# Patient Record
Sex: Female | Born: 1965 | Race: White | Hispanic: No | Marital: Married | Smoking: Former smoker
Health system: Southern US, Community
[De-identification: ages and names within clinical notes are randomized; demographics above are authoritative.]

## PROBLEM LIST (undated history)

## (undated) DIAGNOSIS — E079 Disorder of thyroid, unspecified: Secondary | ICD-10-CM

## (undated) DIAGNOSIS — E039 Hypothyroidism, unspecified: Secondary | ICD-10-CM

## (undated) DIAGNOSIS — R896 Abnormal cytological findings in specimens from other organs, systems and tissues: Secondary | ICD-10-CM

## (undated) HISTORY — DX: Abnormal cytological findings in specimens from other organs, systems and tissues: R89.6

## (undated) HISTORY — DX: Disorder of thyroid, unspecified: E07.9

## (undated) HISTORY — PX: BREAST BIOPSY: SHX20

---

## 2004-12-29 ENCOUNTER — Ambulatory Visit (HOSPITAL_COMMUNITY): Admission: RE | Admit: 2004-12-29 | Discharge: 2004-12-29 | Payer: Self-pay | Admitting: Family Medicine

## 2005-04-11 ENCOUNTER — Encounter: Admission: RE | Admit: 2005-04-11 | Discharge: 2005-04-11 | Payer: Self-pay | Admitting: Obstetrics and Gynecology

## 2005-04-11 ENCOUNTER — Encounter (INDEPENDENT_AMBULATORY_CARE_PROVIDER_SITE_OTHER): Payer: Self-pay | Admitting: Specialist

## 2006-05-20 ENCOUNTER — Encounter: Admission: RE | Admit: 2006-05-20 | Discharge: 2006-05-20 | Payer: Self-pay | Admitting: Obstetrics and Gynecology

## 2007-06-06 ENCOUNTER — Encounter: Admission: RE | Admit: 2007-06-06 | Discharge: 2007-06-06 | Payer: Self-pay | Admitting: Obstetrics and Gynecology

## 2008-10-14 ENCOUNTER — Encounter: Admission: RE | Admit: 2008-10-14 | Discharge: 2008-10-14 | Payer: Self-pay | Admitting: Obstetrics and Gynecology

## 2010-02-03 ENCOUNTER — Encounter: Admission: RE | Admit: 2010-02-03 | Discharge: 2010-02-03 | Payer: Self-pay | Admitting: Obstetrics and Gynecology

## 2010-12-10 DIAGNOSIS — IMO0001 Reserved for inherently not codable concepts without codable children: Secondary | ICD-10-CM

## 2010-12-10 HISTORY — DX: Reserved for inherently not codable concepts without codable children: IMO0001

## 2011-09-06 ENCOUNTER — Other Ambulatory Visit: Payer: Self-pay | Admitting: Gynecology

## 2011-09-06 DIAGNOSIS — Z1231 Encounter for screening mammogram for malignant neoplasm of breast: Secondary | ICD-10-CM

## 2011-09-14 ENCOUNTER — Ambulatory Visit
Admission: RE | Admit: 2011-09-14 | Discharge: 2011-09-14 | Disposition: A | Discharge status: Home / Self Care | Payer: 59 | Source: Ambulatory Visit | Attending: Gynecology | Admitting: Gynecology

## 2011-09-14 DIAGNOSIS — Z1231 Encounter for screening mammogram for malignant neoplasm of breast: Secondary | ICD-10-CM

## 2011-09-27 ENCOUNTER — Ambulatory Visit: Payer: Self-pay | Admitting: Gynecology

## 2011-10-09 ENCOUNTER — Ambulatory Visit (INDEPENDENT_AMBULATORY_CARE_PROVIDER_SITE_OTHER): Payer: 59 | Admitting: Gynecology

## 2011-10-09 ENCOUNTER — Other Ambulatory Visit (HOSPITAL_COMMUNITY)
Admission: RE | Admit: 2011-10-09 | Discharge: 2011-10-09 | Disposition: A | Discharge status: Home / Self Care | Payer: 59 | Source: Ambulatory Visit | Attending: Gynecology | Admitting: Gynecology

## 2011-10-09 ENCOUNTER — Encounter: Payer: Self-pay | Admitting: Gynecology

## 2011-10-09 VITALS — BP 130/70 | Ht 65.25 in | Wt 130.0 lb

## 2011-10-09 DIAGNOSIS — Z1159 Encounter for screening for other viral diseases: Secondary | ICD-10-CM | POA: Insufficient documentation

## 2011-10-09 DIAGNOSIS — Z01419 Encounter for gynecological examination (general) (routine) without abnormal findings: Secondary | ICD-10-CM | POA: Insufficient documentation

## 2011-10-09 NOTE — Progress Notes (Signed)
Catherine Pugh [birth year] [id number]        45 y.o.  for annual exam.  Without complaints former patient of Dr. August Saucer [facility].  Past medical history,surgical history, medications, allergies, family history and social history were all reviewed and documented in the EPIC chart. ROS:  Was performed and pertinent positives and negatives are included in the history.  Exam: chaperone present Filed Vitals:   10/09/11 1541  BP: 130/70   General appearance  Normal Skin grossly normal Head/Neck normal with no cervical or supraclavicular adenopathy thyroid normal Lungs  clear Cardiac RR, without RMG Abdominal  soft, nontender, without masses, organomegaly or hernia Breasts  examined lying and sitting without masses, retractions, discharge or axillary adenopathy. Pelvic  Ext/BUS/vagina  normal   Cervix  normal  Pap done  Uterus  retroverted, normal size, shape and contour, midline and mobile nontender   Adnexa  Without masses or tenderness    Anus and perineum  normal   Rectovaginal  normal sphincter tone without palpated masses or tenderness.    Assessment/Plan:  45 y.o. female for annual exam.   Doing well. SBE monthly reviewed. Had mammogram this year and will continue annual mammography.  Pap was done today as I do not have copies of her other Pap smears. I discussed the current screening guidelines with less frequent screening interval I think assuming this Pap is normal then we'll go to every 3 years. Patient using condoms for birth control. I reviewed the failure risk with this and availability of Plan B. She is comfortable with condoms and accepts the failure risk. No labs were done today as it was recently done through her primary's office. Assuming she continues well from a gynecologic standpoint she will see [place] in a year sooner as needed.   Dara Lords MD, 4:25 PM 10/09/2011

## 2012-12-22 ENCOUNTER — Other Ambulatory Visit: Payer: Self-pay | Admitting: Gynecology

## 2012-12-22 DIAGNOSIS — Z1231 Encounter for screening mammogram for malignant neoplasm of breast: Secondary | ICD-10-CM

## 2012-12-25 ENCOUNTER — Ambulatory Visit (INDEPENDENT_AMBULATORY_CARE_PROVIDER_SITE_OTHER): Payer: 59 | Admitting: Gynecology

## 2012-12-25 ENCOUNTER — Encounter: Payer: Self-pay | Admitting: Gynecology

## 2012-12-25 ENCOUNTER — Other Ambulatory Visit (HOSPITAL_COMMUNITY)
Admission: RE | Admit: 2012-12-25 | Discharge: 2012-12-25 | Disposition: A | Discharge status: Home / Self Care | Payer: 59 | Source: Ambulatory Visit | Attending: Gynecology | Admitting: Gynecology

## 2012-12-25 VITALS — BP 116/66 | Ht 64.0 in | Wt 138.0 lb

## 2012-12-25 DIAGNOSIS — Z01419 Encounter for gynecological examination (general) (routine) without abnormal findings: Secondary | ICD-10-CM

## 2012-12-25 DIAGNOSIS — Z1151 Encounter for screening for human papillomavirus (HPV): Secondary | ICD-10-CM | POA: Insufficient documentation

## 2012-12-25 DIAGNOSIS — Z13220 Encounter for screening for lipoid disorders: Secondary | ICD-10-CM

## 2012-12-25 DIAGNOSIS — N912 Amenorrhea, unspecified: Secondary | ICD-10-CM

## 2012-12-25 LAB — CBC WITH DIFFERENTIAL/PLATELET
Basophils Absolute: 0 10*3/uL (ref 0.0–0.1)
HCT: 41.4 % (ref 36.0–46.0)
Hemoglobin: 14 g/dL (ref 12.0–15.0)
Lymphocytes Relative: 29 % (ref 12–46)
Lymphs Abs: 1.8 10*3/uL (ref 0.7–4.0)
Monocytes Absolute: 0.4 10*3/uL (ref 0.1–1.0)
Neutro Abs: 3.9 10*3/uL (ref 1.7–7.7)
RBC: 4.41 MIL/uL (ref 3.87–5.11)
RDW: 13.9 % (ref 11.5–15.5)
WBC: 6.3 10*3/uL (ref 4.0–10.5)

## 2012-12-25 LAB — COMPREHENSIVE METABOLIC PANEL
ALT: 16 U/L (ref 0–35)
AST: 19 U/L (ref 0–37)
Albumin: 4.2 g/dL (ref 3.5–5.2)
BUN: 9 mg/dL (ref 6–23)
Calcium: 9.3 mg/dL (ref 8.4–10.5)
Chloride: 106 mEq/L (ref 96–112)
Potassium: 4.2 mEq/L (ref 3.5–5.3)

## 2012-12-25 LAB — TSH: TSH: 2.812 u[IU]/mL (ref 0.350–4.500)

## 2012-12-25 LAB — FOLLICLE STIMULATING HORMONE: FSH: 124.8 m[IU]/mL — ABNORMAL HIGH

## 2012-12-25 NOTE — Patient Instructions (Addendum)
Followup in one year for annual exam.  Consider Stop Smoking.  Help is available at [facility]'s smoking cessation program @ www.[place].com or 336-832-0838. OR 1-800-QUIT-NOW (1-800-784-8669) for free smoking cessation counseling.  Smokefree.gov (http://www.smokefree.gov) provides free, accurate, evidence-based information and professional assistance to help support the immediate and long-term needs of people trying to quit smoking.    Smoking Hazards Smoking cigarettes is extremely bad for your health. Tobacco smoke has over 200 known poisons in it. There are over 60 chemicals in tobacco smoke that cause cancer. Some of the chemicals found in cigarette smoke include:  Cyanide.  Benzene.  Formaldehyde.  Methanol (wood alcohol).  Acetylene (fuel used in welding torches).  Ammonia.  Cigarette smoke also contains the poisonous gases nitrogen oxide and carbon monoxide.  Cigarette smokers have an increased risk of many serious medical problems, including: Lung cancer.  Lung disease (such as pneumonia, bronchitis, and emphysema).  Heart attack and chest pain due to the heart not getting enough oxygen (angina).  Heart disease and peripheral blood vessel disease.  Hypertension.  Stroke.  Oral cancer (cancer of the lip, mouth, or voice box).  Bladder cancer.  Pancreatic cancer.  Cervical cancer.  Pregnancy complications, including premature birth.  Low birthweight babies.  Early menopause.  Lower estrogen level for women.  Infertility.  Facial wrinkles.  Blindness.  Increased risk of broken bones (fractures).  Senile dementia.  Stillbirths and smaller newborn babies, birth defects, and genetic damage to sperm.  Stomach ulcers and internal bleeding.  Children of smokers have an increased risk of the following, because of secondhand smoke exposure:  Sudden infant death syndrome (SIDS).  Respiratory infections.  Lung cancer.  Heart disease.  Ear infections.  Smoking causes  approximately: 90% of all lung cancer deaths in men.  80% of all lung cancer deaths in women.  90% of deaths from chronic obstructive lung disease.  Compared with nonsmokers, smoking increases the risk of: Coronary heart disease by 2 to 4 times.  Stroke by 2 to 4 times.  Men developing lung cancer by 23 times.  Women developing lung cancer by 13 times.  Dying from chronic obstructive lung diseases by 12 times.  Someone who smokes 2 packs a day loses about 8 years of his or her life. Even smoking lightly shortens your life expectancy by several years. You can greatly reduce the risk of medical problems for you and your family by stopping now. Smoking is the most preventable cause of death and disease in our society. Within days of quitting smoking, your circulation returns to normal, you decrease the risk of having a heart attack, and your lung capacity improves. There may be some increased phlegm in the first few days after quitting, and it may take months for your lungs to clear up completely. Quitting for 10 years cuts your lung cancer risk to almost that of a nonsmoker. WHY IS SMOKING ADDICTIVE? Nicotine is the chemical agent in tobacco that is capable of causing addiction or dependence.  When you smoke and inhale, nicotine is absorbed rapidly into the bloodstream through your lungs. Nicotine absorbed through the lungs is capable of creating a powerful addiction. Both inhaled and non-inhaled nicotine may be addictive.  Addiction studies of cigarettes and spit tobacco show that addiction to nicotine occurs mainly during the teen years, when young people begin using tobacco products.  WHAT ARE THE BENEFITS OF QUITTING?  There are many health benefits to quitting smoking.  Likelihood of developing cancer and heart disease   decreases. Health improvements are seen almost immediately.  Blood pressure, pulse rate, and breathing patterns start returning to normal soon after quitting.  People who quit  may see an improvement in their overall quality of life.  Some people choose to quit all at once. Other options include nicotine replacement products, such as patches, gum, and nasal sprays. Do not use these products without first checking with your caregiver. QUITTING SMOKING It is not easy to quit smoking. Nicotine is addicting, and longtime habits are hard to change. To start, you can write down all your reasons for quitting, tell your family and friends you want to quit, and ask for their help. Throw your cigarettes away, chew gum or cinnamon sticks, keep your hands busy, and drink extra water or juice. Go for walks and practice deep breathing to relax. Think of all the money you are saving: around $1,000 a year, for the average pack-a-day smoker. Nicotine patches and gum have been shown to improve success at efforts to stop smoking. Zyban (bupropion) is an anti-depressant drug that can be prescribed to reduce nicotine withdrawal symptoms and to suppress the urge to smoke. Smoking is an addiction with both physical and psychological effects. Joining a stop-smoking support group can help you cope with the emotional issues. For more information and advice on programs to stop smoking, call your doctor, your local hospital, or these organizations: American Lung Association - 1-800-LUNGUSA   Smoking Cessation  This document explains the best ways for you to quit smoking and new treatments to help. It lists new medicines that can double or triple your chances of quitting and quitting for good. It also considers ways to avoid relapses and concerns you may have about quitting, including weight gain. NICOTINE: A POWERFUL ADDICTION If you have tried to quit smoking, you know how hard it can be. It is hard because nicotine is a very addictive drug. For some people, it can be as addictive as heroin or cocaine. Usually, people make 2 or 3 tries, or more, before finally being able to quit. Each time you try to  quit, you can learn about what helps and what hurts. Quitting takes hard work and a lot of effort, but you can quit smoking. QUITTING SMOKING IS ONE OF THE MOST IMPORTANT THINGS YOU WILL EVER DO.  You will live longer, feel better, and live better.   The impact on your body of quitting smoking is felt almost immediately:   Within 20 minutes, blood pressure decreases. Pulse returns to its normal level.   After 8 hours, carbon monoxide levels in the blood return to normal. Oxygen level increases.   After 24 hours, chance of heart attack starts to decrease. Breath, hair, and body stop smelling like smoke.   After 48 hours, damaged nerve endings begin to recover. Sense of taste and smell improve.   After 72 hours, the body is virtually free of nicotine. Bronchial tubes relax and breathing becomes easier.   After 2 to 12 weeks, lungs can hold more air. Exercise becomes easier and circulation improves.   Quitting will reduce your risk of having a heart attack, stroke, cancer, or lung disease:   After 1 year, the risk of coronary heart disease is cut in half.   After 5 years, the risk of stroke falls to the same as a nonsmoker.   After 10 years, the risk of lung cancer is cut in half and the risk of other cancers decreases significantly.   After 15 years,   the risk of coronary heart disease drops, usually to the level of a nonsmoker.   If you are pregnant, quitting smoking will improve your chances of having a healthy baby.   The people you live with, especially your children, will be healthier.   You will have extra money to spend on things other than cigarettes.  FIVE KEYS TO QUITTING Studies have shown that these 5 steps will help you quit smoking and quit for good. You have the best chances of quitting if you use them together: 1. Get ready.  2. Get support and encouragement.  3. Learn new skills and behaviors.  4. Get medicine to reduce your nicotine addiction and use it  correctly.  5. Be prepared for relapse or difficult situations. Be determined to continue trying to quit, even if you do not succeed at first.  1. GET READY  Set a quit date.   Change your environment.   Get rid of ALL cigarettes, ashtrays, matches, and lighters in your home, car, and place of work.   Do not let people smoke in your home.   Review your past attempts to quit. Think about what worked and what did not.   Once you quit, do not smoke. NOT EVEN A PUFF!  2. GET SUPPORT AND ENCOURAGEMENT Studies have shown that you have a better chance of being successful if you have help. You can get support in many ways.  Tell your family, friends, and coworkers that you are going to quit and need their support. Ask them not to smoke around you.   Talk to your caregivers (doctor, dentist, nurse, pharmacist, psychologist, and/or smoking counselor).   Get individual, group, or telephone counseling and support. The more counseling you have, the better your chances are of quitting. Programs are available at local hospitals and health centers. Call your local health department for information about programs in your area.   Spiritual beliefs and practices may help some smokers quit.   Quit meters are small computer programs online or downloadable that keep track of quit statistics, such as amount of "quit-time," cigarettes not smoked, and money saved.   Many smokers find one or more of the many self-help books available useful in helping them quit and stay off tobacco.  3. LEARN NEW SKILLS AND BEHAVIORS  Try to distract yourself from urges to smoke. Talk to someone, go for a walk, or occupy your time with a task.   When you first try to quit, change your routine. Take a different route to work. Drink tea instead of coffee. Eat breakfast in a different place.   Do something to reduce your stress. Take a hot bath, exercise, or read a book.   Plan something enjoyable to do every day. Reward  yourself for not smoking.   Explore interactive web-based programs that specialize in helping you quit.  4. GET MEDICINE AND USE IT CORRECTLY Medicines can help you stop smoking and decrease the urge to smoke. Combining medicine with the above behavioral methods and support can quadruple your chances of successfully quitting smoking. The U.S. Food and Drug Administration (FDA) has approved 7 medicines to help you quit smoking. These medicines fall into 3 categories.  Nicotine replacement therapy (delivers nicotine to your body without the negative effects and risks of smoking):   Nicotine gum: Available over-the-counter.   Nicotine lozenges: Available over-the-counter.   Nicotine inhaler: Available by prescription.   Nicotine nasal spray: Available by prescription.   Nicotine skin patches (transdermal): Available   by prescription and over-the-counter.   Antidepressant medicine (helps people abstain from smoking, but how this works is unknown):   Bupropion sustained-release (SR) tablets: Available by prescription.   Nicotinic receptor partial agonist (simulates the effect of nicotine in your brain):   Varenicline tartrate tablets: Available by prescription.   Ask your caregiver for advice about which medicines to use and how to use them. Carefully read the information on the package.   Everyone who is trying to quit may benefit from using a medicine. If you are pregnant or trying to become pregnant, nursing an infant, you are under age 18, or you smoke fewer than 10 cigarettes per day, talk to your caregiver before taking any nicotine replacement medicines.   You should stop using a nicotine replacement product and call your caregiver if you experience nausea, dizziness, weakness, vomiting, fast or irregular heartbeat, mouth problems with the lozenge or gum, or redness or swelling of the skin around the patch that does not go away.   Do not use any other product containing nicotine  while using a nicotine replacement product.   Talk to your caregiver before using these products if you have diabetes, heart disease, asthma, stomach ulcers, you had a recent heart attack, you have high blood pressure that is not controlled with medicine, a history of irregular heartbeat, or you have been prescribed medicine to help you quit smoking.  5. BE PREPARED FOR RELAPSE OR DIFFICULT SITUATIONS  Most relapses occur within the first 3 months after quitting. Do not be discouraged if you start smoking again. Remember, most people try several times before they finally quit.   You may have symptoms of withdrawal because your body is used to nicotine. You may crave cigarettes, be irritable, feel very hungry, cough often, get headaches, or have difficulty concentrating.   The withdrawal symptoms are only temporary. They are strongest when you first quit, but they will go away within 10 to 14 days.  Here are some difficult situations to watch for:  Alcohol. Avoid drinking alcohol. Drinking lowers your chances of successfully quitting.   Caffeine. Try to reduce the amount of caffeine you consume. It also lowers your chances of successfully quitting.   Other smokers. Being around smoking can make you want to smoke. Avoid smokers.   Weight gain. Many smokers will gain weight when they quit, usually less than 10 pounds. Eat a healthy diet and stay active. Do not let weight gain distract you from your main goal, quitting smoking. Some medicines that help you quit smoking may also help delay weight gain. You can always lose the weight gained after you quit.   Bad mood or depression. There are a lot of ways to improve your mood other than smoking.  If you are having problems with any of these situations, talk to your caregiver. SPECIAL SITUATIONS AND CONDITIONS Studies suggest that everyone can quit smoking. Your situation or condition can give you a special reason to quit.  Pregnant women/new  mothers: By quitting, you protect your baby's health and your own.   Hospitalized patients: By quitting, you reduce health problems and help healing.   Heart attack patients: By quitting, you reduce your risk of a second heart attack.   Lung, head, and neck cancer patients: By quitting, you reduce your chance of a second cancer.   Parents of children and adolescents: By quitting, you protect your children from illnesses caused by secondhand smoke.  QUESTIONS TO THINK ABOUT Think about the following questions   before you try to stop smoking. You may want to talk about your answers with your caregiver.  Why do you want to quit?   If you tried to quit in the past, what helped and what did not?   What will be the most difficult situations for you after you quit? How will you plan to handle them?   Who can help you through the tough times? Your family? Friends? Caregiver?   What pleasures do you get from smoking? What ways can you still get pleasure if you quit?  Here are some questions to ask your caregiver:  How can you help me to be successful at quitting?   What medicine do you think would be best for me and how should I take it?   What should I do if I need more help?   What is smoking withdrawal like? How can I get information on withdrawal?  Quitting takes hard work and a lot of effort, but you can quit smoking.   

## 2012-12-25 NOTE — Progress Notes (Signed)
Catherine Pugh Mar 12, 1966 [id number]        47 y.o.  G2P2 for annual exam.  Several issues that are below  Past medical history,surgical history, medications, allergies, family history and social history were all reviewed and documented in the EPIC chart. ROS:  Was performed and pertinent positives and negatives are included in the history.  Exam: Kim assistant Filed Vitals:   12/25/12 1125  BP: 116/66  Height: 5\' 4"  (1.626 m)  Weight: 138 lb (62.596 kg)   General appearance  Normal Skin grossly normal Head/Neck normal with no cervical or supraclavicular adenopathy thyroid normal Lungs  clear Cardiac RR, without RMG Abdominal  soft, nontender, without masses, organomegaly or hernia Breasts  examined lying and sitting without masses, retractions, discharge or axillary adenopathy. Pelvic  Ext/BUS/vagina  normal   Cervix  normal Pap/HPV  Uterus  anteverted, normal size, shape and contour, midline and mobile nontender   Adnexa  Without masses or tenderness    Anus and perineum  normal   Rectovaginal  normal sphincter tone without palpated masses or tenderness.    Assessment/Plan:  47 y.o. G2P2 female for annual exam.   1. Amenorrhea/menopausal symptoms. Patient notes her menses have become more sporadic this past year which she would have skips and then no menses since LMP 08/10/2012. She is having hot flashes and night sweats. No skin hair or weight changes. Will check FSH TSH. Discussed probable menopause and options to include expectant management, trial of soy based products, HRT. WHI study with risks to include stroke heart attack DVT of breast cancer reviewed. Patient is not interested in treatment at this time but would prefer observation. She will keep a menstrual calendar. As long as no menses or occasional normal menses then will follow. If she has prolonged or atypical bleeding or goes more than one year without bleeding she does report this. Need for contraception until one  year without menses also discussed. 2. Mammography 2012. Patient overdo it has scheduled and will follow up for this. SBE monthly reviewed. 3. Pap smear 2012 showed ASCUS negative high-risk HPV. Pap/HPV done today. 4. Stop smoking reviewed and strategies discussed. 5. Health maintenance. Baseline CBC comprehensive metabolic panel lipid profile urinalysis ordered along with her FSH TSH. Follow up for results and then annually, sooner as needed    Dara Lords MD, 12:19 PM 12/25/2012

## 2012-12-26 LAB — URINALYSIS W MICROSCOPIC + REFLEX CULTURE
Casts: NONE SEEN
Ketones, ur: NEGATIVE mg/dL
Nitrite: NEGATIVE
Protein, ur: NEGATIVE mg/dL
Urobilinogen, UA: 0.2 mg/dL (ref 0.0–1.0)

## 2012-12-29 ENCOUNTER — Other Ambulatory Visit: Payer: Self-pay | Admitting: Gynecology

## 2012-12-29 LAB — URINE CULTURE: Colony Count: >=100000

## 2012-12-29 MED ORDER — SULFAMETHOXAZOLE-TRIMETHOPRIM 800-160 MG PO TABS: 1.0000 | ORAL_TABLET | Freq: Two times a day (BID) | ORAL | Status: DC

## 2013-01-14 ENCOUNTER — Ambulatory Visit: Payer: 59

## 2013-01-24 ENCOUNTER — Other Ambulatory Visit: Payer: Self-pay

## 2013-01-28 ENCOUNTER — Ambulatory Visit
Admission: RE | Admit: 2013-01-28 | Discharge: 2013-01-28 | Disposition: A | Discharge status: Home / Self Care | Payer: 59 | Source: Ambulatory Visit | Attending: Gynecology | Admitting: Gynecology

## 2013-03-31 ENCOUNTER — Telehealth: Payer: Self-pay | Admitting: Family Medicine

## 2013-03-31 MED ORDER — FEXOFENADINE HCL 180 MG PO TABS: 180.0000 mg | ORAL_TABLET | Freq: Every day | ORAL | Status: DC

## 2013-03-31 NOTE — Telephone Encounter (Signed)
Rx sent in to [facility]. Patient notified. Patient states that she also needs a prescription for Patanol sent in to pharmacy.

## 2013-03-31 NOTE — Telephone Encounter (Signed)
Allegra 180 numb 30 one qd three ref

## 2013-03-31 NOTE — Telephone Encounter (Signed)
Patient needs a season allergy med called into [place]

## 2013-04-01 ENCOUNTER — Other Ambulatory Visit: Payer: Self-pay

## 2013-04-01 MED ORDER — OLOPATADINE HCL 0.1 % OP SOLN: 1.0000 [drp] | Freq: Two times a day (BID) | OPHTHALMIC | Status: DC

## 2013-04-01 NOTE — Telephone Encounter (Signed)
patanol one drop bid three ref

## 2013-04-01 NOTE — Telephone Encounter (Signed)
Patanol one drop bid three ref sent in to [place]. Patient notified.

## 2013-07-31 ENCOUNTER — Encounter: Payer: Self-pay | Admitting: Family Medicine

## 2013-07-31 ENCOUNTER — Ambulatory Visit (INDEPENDENT_AMBULATORY_CARE_PROVIDER_SITE_OTHER): Payer: 59 | Admitting: Family Medicine

## 2013-07-31 VITALS — BP 112/76 | Ht 65.0 in | Wt 146.2 lb

## 2013-07-31 DIAGNOSIS — M7712 Lateral epicondylitis, left elbow: Secondary | ICD-10-CM | POA: Insufficient documentation

## 2013-07-31 DIAGNOSIS — R232 Flushing: Secondary | ICD-10-CM | POA: Insufficient documentation

## 2013-07-31 DIAGNOSIS — M722 Plantar fascial fibromatosis: Secondary | ICD-10-CM | POA: Insufficient documentation

## 2013-07-31 DIAGNOSIS — N951 Menopausal and female climacteric states: Secondary | ICD-10-CM

## 2013-07-31 DIAGNOSIS — M7710 Lateral epicondylitis, unspecified elbow: Secondary | ICD-10-CM

## 2013-07-31 MED ORDER — DICLOFENAC SODIUM 75 MG PO TBEC: 75.0000 mg | DELAYED_RELEASE_TABLET | Freq: Two times a day (BID) | ORAL | Status: DC

## 2013-07-31 NOTE — Patient Instructions (Signed)
Plantar Fasciitis Plantar fasciitis is a common condition that causes foot pain. It is soreness (inflammation) of the band of tough fibrous tissue on the bottom of the foot that runs from the heel bone (calcaneus) to the ball of the foot. The cause of this soreness may be from excessive standing, poor fitting shoes, running on hard surfaces, being overweight, having an abnormal walk, or overuse (this is common in runners) of the painful foot or feet. It is also common in aerobic exercise dancers and ballet dancers. SYMPTOMS  Most people with plantar fasciitis complain of: Severe pain in the morning on the bottom of their foot especially when taking the first steps out of bed. This pain recedes after a few minutes of walking. Severe pain is experienced also during walking following a long period of inactivity. Pain is worse when walking barefoot or up stairs DIAGNOSIS  Your caregiver will diagnose this condition by examining and feeling your foot. Special tests such as X-rays of your foot, are usually not needed. PREVENTION  Consult a sports medicine professional before beginning a new exercise program. Walking programs offer a good workout. With walking there is a lower chance of overuse injuries common to runners. There is less impact and less jarring of the joints. Begin all new exercise programs slowly. If problems or pain develop, decrease the amount of time or distance until you are at a comfortable level. Wear good shoes and replace them regularly. Stretch your foot and the heel cords at the back of the ankle (Achilles tendon) both before and after exercise. Run or exercise on even surfaces that are not hard. For example, asphalt is better than pavement. Do not run barefoot on hard surfaces. If using a treadmill, vary the incline. Do not continue to workout if you have foot or joint problems. Seek professional help if they do not improve. HOME CARE INSTRUCTIONS  Avoid activities that cause  you pain until you recover. Use ice or cold packs on the problem or painful areas after working out. Only take over-the-counter or prescription medicines for pain, discomfort, or fever as directed by your caregiver. Soft shoe inserts or athletic shoes with air or gel sole cushions may be helpful. If problems continue or become more severe, consult a sports medicine caregiver or your own health care provider. Cortisone is a potent anti-inflammatory medication that may be injected into the painful area. You can discuss this treatment with your caregiver. MAKE SURE YOU:  Understand these instructions. Will watch your condition. Will get help right away if you are not doing well or get worse. Document Released: 08/21/2001 Document Revised: 02/18/2012 Document Reviewed: 10/20/2008 [facility] Patient Information 2014 [place], [place]. Lateral Epicondylitis (Tennis Elbow) with Rehab Lateral epicondylitis involves inflammation and pain around the outer portion of the elbow. The pain is caused by inflammation of the tendons in the forearm that bring back (extend) the wrist. Lateral epicondylittis is also called tennis elbow, because it is very common in tennis players. However, it may occur in any individual who extends the wrist repetitively. If lateral epicondylitis is left untreated, it may become a chronic problem. SYMPTOMS   Pain, tenderness, and inflammation on the outer (lateral) side of the elbow.  Pain or weakness with gripping activities.  Pain that increases with wrist twisting motions (playing tennis, using a screwdriver, opening a door or a jar).  Pain with lifting objects, including a coffee cup. CAUSES  Lateral epicondylitis is caused by inflammation of the tendons that extend the wrist.  Causes of injury may include:  Repetitive stress and strain on the muscles and tendons that extend the wrist.  Sudden change in activity level or intensity.  Incorrect grip in racquet  sports.  Incorrect grip size of racquet (often too large).  Incorrect hitting position or technique (usually backhand, leading with the elbow).  Using a racket that is too heavy. RISK INCREASES WITH:  Sports or occupations that require repetitive and/or strenuous forearm and wrist movements (tennis, squash, racquetball, carpentry).  Poor wrist and forearm strength and flexibility.  Failure to warm up properly before activity.  Resuming activity before healing, rehabilitation, and conditioning are complete. PREVENTION   Warm up and stretch properly before activity.  Maintain physical fitness:  Strength, flexibility, and endurance.  Cardiovascular fitness.  Wear and use properly fitted equipment.  Learn and use proper technique and have a coach correct improper technique.  Wear a tennis elbow (counterforce) brace. PROGNOSIS  The course of this condition depends on the degree of the injury. If treated properly, acute cases (symptoms lasting less than 4 weeks) are often resolved in 2 to 6 weeks. Chronic (longer lasting cases) often resolve in 3 to 6 months, but may require physical therapy. RELATED COMPLICATIONS   Frequently recurring symptoms, resulting in a chronic problem. Properly treating the problem the first time decreases frequency of recurrence.  Chronic inflammation, scarring tendon degeneration, and partial tendon tear, requiring surgery.  Delayed healing or resolution of symptoms. TREATMENT  Treatment first involves the use of ice and medicine, to reduce pain and inflammation. Strengthening and stretching exercises may help reduce discomfort, if performed regularly. These exercises may be performed at home, if the condition is an acute injury. Chronic cases may require a referral to a physical therapist for evaluation and treatment. Your caregiver may advise a corticosteroid injection, to help reduce inflammation. Rarely, surgery is needed. MEDICATION  If pain  medicine is needed, nonsteroidal anti-inflammatory medicines (aspirin and ibuprofen), or other minor pain relievers (acetaminophen), are often advised.  Do not take pain medicine for 7 days before surgery.  Prescription pain relievers may be given, if your caregiver thinks they are needed. Use only as directed and only as much as you need.  Corticosteroid injections may be recommended. These injections should be reserved only for the most severe cases, because they can only be given a certain number of times. HEAT AND COLD  Cold treatment (icing) should be applied for 10 to 15 minutes every 2 to 3 hours for inflammation and pain, and immediately after activity that aggravates your symptoms. Use ice packs or an ice massage.  Heat treatment may be used before performing stretching and strengthening activities prescribed by your caregiver, physical therapist, or athletic trainer. Use a heat pack or a warm water soak. SEEK MEDICAL CARE IF: Symptoms get worse or do not improve in 2 weeks, despite treatment. EXERCISES  RANGE OF MOTION (ROM) AND STRETCHING EXERCISES - Epicondylitis, Lateral (Tennis Elbow) These exercises may help you when beginning to rehabilitate your injury. Your symptoms may go away with or without further involvement from your physician, physical therapist or athletic trainer. While completing these exercises, remember:   Restoring tissue flexibility helps normal motion to return to the joints. This allows healthier, less painful movement and activity.  An effective stretch should be held for at least 30 seconds.  A stretch should never be painful. You should only feel a gentle lengthening or release in the stretched tissue. RANGE OF MOTION  Wrist Flexion, Active-Assisted  Extend your right / left elbow with your fingers pointing down.*  Gently pull the back of your hand towards you, until you feel a gentle stretch on the top of your forearm.  Hold this position for  __________ seconds. Repeat __________ times. Complete this exercise __________ times per day.  *If directed by your physician, physical therapist or athletic trainer, complete this stretch with your elbow bent, rather than extended. RANGE OF MOTION  Wrist Extension, Active-Assisted  Extend your right / left elbow and turn your palm upwards.*  Gently pull your palm and fingertips back, so your wrist extends and your fingers point more toward the ground.  You should feel a gentle stretch on the inside of your forearm.  Hold this position for __________ seconds. Repeat __________ times. Complete this exercise __________ times per day. *If directed by your physician, physical therapist or athletic trainer, complete this stretch with your elbow bent, rather than extended. STRETCH - Wrist Flexion  Place the back of your right / left hand on a tabletop, leaving your elbow slightly bent. Your fingers should point away from your body.  Gently press the back of your hand down onto the table by straightening your elbow. You should feel a stretch on the top of your forearm.  Hold this position for __________ seconds. Repeat __________ times. Complete this stretch __________ times per day.  STRETCH  Wrist Extension   Place your right / left fingertips on a tabletop, leaving your elbow slightly bent. Your fingers should point backwards.  Gently press your fingers and palm down onto the table by straightening your elbow. You should feel a stretch on the inside of your forearm.  Hold this position for __________ seconds. Repeat __________ times. Complete this stretch __________ times per day.  STRENGTHENING EXERCISES - Epicondylitis, Lateral (Tennis Elbow) These exercises may help you when beginning to rehabilitate your injury. They may resolve your symptoms with or without further involvement from your physician, physical therapist or athletic trainer. While completing these exercises, remember:    Muscles can gain both the endurance and the strength needed for everyday activities through controlled exercises.  Complete these exercises as instructed by your physician, physical therapist or athletic trainer. Increase the resistance and repetitions only as guided.  You may experience muscle soreness or fatigue, but the pain or discomfort you are trying to eliminate should never worsen during these exercises. If this pain does get worse, stop and make sure you are following the directions exactly. If the pain is still present after adjustments, discontinue the exercise until you can discuss the trouble with your caregiver. STRENGTH Wrist Flexors  Sit with your right / left forearm palm-up and fully supported on a table or countertop. Your elbow should be resting below the height of your shoulder. Allow your wrist to extend over the edge of the surface.  Loosely holding a __________ weight, or a piece of rubber exercise band or tubing, slowly curl your hand up toward your forearm.  Hold this position for __________ seconds. Slowly lower the wrist back to the starting position in a controlled manner. Repeat __________ times. Complete this exercise __________ times per day.  STRENGTH  Wrist Extensors  Sit with your right / left forearm palm-down and fully supported on a table or countertop. Your elbow should be resting below the height of your shoulder. Allow your wrist to extend over the edge of the surface.  Loosely holding a __________ weight, or a piece of rubber exercise band or  tubing, slowly curl your hand up toward your forearm.  Hold this position for __________ seconds. Slowly lower the wrist back to the starting position in a controlled manner. Repeat __________ times. Complete this exercise __________ times per day.  STRENGTH - Ulnar Deviators  Stand with a ____________________ weight in your right / left hand, or sit while holding a rubber exercise band or tubing, with your  healthy arm supported on a table or countertop.  Move your wrist, so that your pinkie travels toward your forearm and your thumb moves away from your forearm.  Hold this position for __________ seconds and then slowly lower the wrist back to the starting position. Repeat __________ times. Complete this exercise __________ times per day STRENGTH - Radial Deviators  Stand with a ____________________ weight in your right / left hand, or sit while holding a rubber exercise band or tubing, with your injured arm supported on a table or countertop.  Raise your hand upward in front of you or pull up on the rubber tubing.  Hold this position for __________ seconds and then slowly lower the wrist back to the starting position. Repeat __________ times. Complete this exercise __________ times per day. STRENGTH  Forearm Supinators   Sit with your right / left forearm supported on a table, keeping your elbow below shoulder height. Rest your hand over the edge, palm down.  Gently grip a hammer or a soup ladle.  Without moving your elbow, slowly turn your palm and hand upward to a "thumbs-up" position.  Hold this position for __________ seconds. Slowly return to the starting position. Repeat __________ times. Complete this exercise __________ times per day.  STRENGTH  Forearm Pronators   Sit with your right / left forearm supported on a table, keeping your elbow below shoulder height. Rest your hand over the edge, palm up.  Gently grip a hammer or a soup ladle.  Without moving your elbow, slowly turn your palm and hand upward to a "thumbs-up" position.  Hold this position for __________ seconds. Slowly return to the starting position. Repeat __________ times. Complete this exercise __________ times per day.  STRENGTH - Grip  Grasp a tennis ball, a dense sponge, or a large, rolled sock in your hand.  Squeeze as hard as you can, without increasing any pain.  Hold this position for __________  seconds. Release your grip slowly. Repeat __________ times. Complete this exercise __________ times per day.  STRENGTH - Elbow Extensors, Isometric  Stand or sit upright, on a firm surface. Place your right / left arm so that your palm faces your stomach, and it is at the height of your waist.  Place your opposite hand on the underside of your forearm. Gently push up as your right / left arm resists. Push as hard as you can with both arms, without causing any pain or movement at your right / left elbow. Hold this stationary position for __________ seconds. Gradually release the tension in both arms. Allow your muscles to relax completely before repeating. Document Released: 11/26/2005 Document Revised: 02/18/2012 Document Reviewed: 03/10/2009 [facility] Patient Information 2014 [place], [place].

## 2013-07-31 NOTE — Progress Notes (Signed)
  Subjective:    Patient ID: Catherine Pugh, female    DOB: [birth year], 47 y.o.   MRN: [id number]  HPI Pt complaining of pain and swelling in left foot and elbow. Foot pain started 4 weeks ago, and elbow started 4 months ago. She also said the elbow has tingles. Takes ibuprofen prn, on occasion,  Also concerned about going through menopause. Still having hot flashes. Was tested blood wise, into early menopause via the blood work.    Left elbow sharp pain, for in four months, Wore elbow pads, then elbow improved.around the elbow pad  Foot pain, limping at times, pain bad right at the start of the day, cramps at night. Does wear fashionable shoes with minimal condition often at work and around. Occasional swelling in left ankle associated with this.  Since many of her symptoms are on left side patient was worried about some underlying serious problem  Walking mostly,several times per wk.      Review of Systems    no chest pain no abdominal pain no shortness of breath ROS otherwise negative Objective:   Physical Exam Alert no acute distress. Vitals stable. HEENT normal. Lungs clear. Heart regular in rhythm. Left lateral elbow tenderness palpation good range of motion.  Feet sensation intact pulses good trace edema left ankle some plantar surface tenderness primarily in the heel Achilles tendon good       Assessment & Plan:  Impression 1 plantar fascitis discussed at length. #2 lateral epicondylitis discussed at length. #3 menopausal symptomatology discussed at length. Plan Voltaren twice a day with food when necessary. Regular exercise encourage. Form strap for arm. Heel cups to wear her in shoes. Start wearing walking shoes. Wear slippers in the house. Expect slow resolution. No x-rays rationale discussed. No prescription therapy for hot flashes unfortunately discussed at length. WSL

## 2013-10-15 ENCOUNTER — Other Ambulatory Visit: Payer: Self-pay

## 2013-12-28 ENCOUNTER — Encounter: Payer: 59 | Admitting: Gynecology

## 2014-01-20 ENCOUNTER — Encounter: Payer: Self-pay | Admitting: Gynecology

## 2014-01-20 ENCOUNTER — Ambulatory Visit (INDEPENDENT_AMBULATORY_CARE_PROVIDER_SITE_OTHER): Payer: 59 | Admitting: Gynecology

## 2014-01-20 VITALS — BP 120/76 | Ht 65.0 in | Wt 147.0 lb

## 2014-01-20 DIAGNOSIS — N951 Menopausal and female climacteric states: Secondary | ICD-10-CM

## 2014-01-20 DIAGNOSIS — Z01419 Encounter for gynecological examination (general) (routine) without abnormal findings: Secondary | ICD-10-CM

## 2014-01-20 NOTE — Progress Notes (Signed)
Catherine Pugh 1966/05/21 [id number]        48 y.o.  G2P2 for annual exam.  Several issues noted below.  Past medical history,surgical history, problem list, medications, allergies, family history and social history were all reviewed and documented in the EPIC chart.  ROS:  Performed and pertinent positives and negatives are included in the history, assessment and plan .  Exam: Kim assistant Filed Vitals:   01/20/14 1205  BP: 120/76  Height: 5\' 5"  (1.651 m)  Weight: 147 lb (66.679 kg)   General appearance  Normal Skin grossly normal Head/Neck normal with no cervical or supraclavicular adenopathy thyroid normal Lungs  clear Cardiac RR, without RMG Abdominal  soft, nontender, without masses, organomegaly or hernia Breasts  examined lying and sitting without masses, retractions, discharge or axillary adenopathy. Pelvic  Ext/BUS/vagina  Normal  Cervix  Normal  Uterus  anteverted, normal size, shape and contour, midline and mobile nontender   Adnexa  Without masses or tenderness    Anus and perineum  Normal   Rectovaginal  Normal sphincter tone without palpated masses or tenderness.    Assessment/Plan:  48 y.o. G2P2 female for annual exam.   1. Postmenopausal/menopausal symptoms. Patient has been over one year without menses. She is having some hot flashes. FSH last year was 124. Options for management include OTC soy based products, non-hormonal pharmacologic such as Effexor and HRT was reviewed.  I reviewed the whole issue of HRT with her to include the WHI study with increased risk of stroke, heart attack, DVT and breast cancer. The ACOG and NAMS statements for lowest dose for the shortest period of time reviewed. Transdermal versus oral first-pass effect benefit discussed. Patient does not want to do anything other than try OTC soy-based products. Will call me if symptoms worsen or she wants to rediscuss options. No dyspareunia, vaginal dryness or any vaginal bleeding. Patient knows  to call with any vaginal bleeding. 2. Pap smear/HPV negative 12/2012. No Pap smear done today. History of ASCUS negative high-risk HPV 2012. Plan repeat Pap smear in 3-5 year intervals. 3. Mammography 01/2013. Due to repeat now and agrees to do so. SBE monthly review. 4. Health maintenance. Patient reports having routine blood work done through her primary physician's office. No blood work done today. Followup one year, sooner as needed.   Note: This document was prepared with digital dictation and possible smart phrase technology. Any transcriptional errors that result from this process are unintentional.   Dara LordsFONTAINE,Julien Oscar P MD, 1:03 PM 01/20/2014

## 2014-01-20 NOTE — Patient Instructions (Signed)
Follow up in one year, sooner as needed. 

## 2014-01-21 LAB — URINALYSIS W MICROSCOPIC + REFLEX CULTURE
BILIRUBIN URINE: NEGATIVE
CASTS: NONE SEEN
Crystals: NONE SEEN
GLUCOSE, UA: NEGATIVE mg/dL
KETONES UR: NEGATIVE mg/dL
Nitrite: NEGATIVE
PH: 7 (ref 5.0–8.0)
PROTEIN: NEGATIVE mg/dL
Specific Gravity, Urine: 1.007 (ref 1.005–1.030)
Urobilinogen, UA: 0.2 mg/dL (ref 0.0–1.0)

## 2014-01-22 ENCOUNTER — Other Ambulatory Visit: Payer: Self-pay | Admitting: Gynecology

## 2014-01-22 MED ORDER — SULFAMETHOXAZOLE-TMP DS 800-160 MG PO TABS: 1.0000 | ORAL_TABLET | Freq: Two times a day (BID) | ORAL | Status: DC

## 2014-01-23 LAB — URINE CULTURE

## 2014-08-02 ENCOUNTER — Telehealth: Payer: Self-pay | Admitting: Family Medicine

## 2014-08-02 DIAGNOSIS — Z79899 Other long term (current) drug therapy: Secondary | ICD-10-CM

## 2014-08-02 DIAGNOSIS — Z0189 Encounter for other specified special examinations: Secondary | ICD-10-CM

## 2014-08-02 NOTE — Telephone Encounter (Signed)
Left message on voicemail notifying patient bloodwork has been ordered.  

## 2014-08-02 NOTE — Telephone Encounter (Signed)
Patient needs order for blood work. °

## 2014-08-02 NOTE — Telephone Encounter (Signed)
Met 7 lip liv 

## 2014-08-04 LAB — LIPID PANEL
Cholesterol: 196 mg/dL (ref 0–200)
HDL: 63 mg/dL (ref >39)
LDL CALC: 112 mg/dL — AB (ref 0–99)
Total CHOL/HDL Ratio: 3.1 Ratio
Triglycerides: 106 mg/dL (ref <150)
VLDL: 21 mg/dL (ref 0–40)

## 2014-08-04 LAB — BASIC METABOLIC PANEL
BUN: 13 mg/dL (ref 6–23)
CHLORIDE: 107 meq/L (ref 96–112)
CO2: 25 meq/L (ref 19–32)
CREATININE: 0.72 mg/dL (ref 0.50–1.10)
Calcium: 9 mg/dL (ref 8.4–10.5)
GLUCOSE: 87 mg/dL (ref 70–99)
Potassium: 4.2 mEq/L (ref 3.5–5.3)
Sodium: 138 mEq/L (ref 135–145)

## 2014-08-04 LAB — HEPATIC FUNCTION PANEL
ALBUMIN: 4.2 g/dL (ref 3.5–5.2)
ALK PHOS: 62 U/L (ref 39–117)
ALT: 15 U/L (ref 0–35)
AST: 16 U/L (ref 0–37)
BILIRUBIN DIRECT: 0.1 mg/dL (ref 0.0–0.3)
BILIRUBIN TOTAL: 0.5 mg/dL (ref 0.2–1.2)
Indirect Bilirubin: 0.4 mg/dL (ref 0.2–1.2)
Total Protein: 6.4 g/dL (ref 6.0–8.3)

## 2014-08-11 ENCOUNTER — Encounter: Payer: 59 | Admitting: Family Medicine

## 2014-08-13 ENCOUNTER — Ambulatory Visit (HOSPITAL_COMMUNITY)
Admission: RE | Admit: 2014-08-13 | Discharge: 2014-08-13 | Disposition: A | Discharge status: Home / Self Care | Payer: 59 | Source: Ambulatory Visit | Attending: Family Medicine | Admitting: Family Medicine

## 2014-08-13 ENCOUNTER — Ambulatory Visit (INDEPENDENT_AMBULATORY_CARE_PROVIDER_SITE_OTHER): Payer: 59 | Admitting: Family Medicine

## 2014-08-13 ENCOUNTER — Encounter: Payer: Self-pay | Admitting: Family Medicine

## 2014-08-13 VITALS — BP 122/80 | Ht 64.5 in | Wt 142.2 lb

## 2014-08-13 DIAGNOSIS — Z87891 Personal history of nicotine dependence: Secondary | ICD-10-CM | POA: Diagnosis not present

## 2014-08-13 DIAGNOSIS — R079 Chest pain, unspecified: Secondary | ICD-10-CM | POA: Insufficient documentation

## 2014-08-13 DIAGNOSIS — R0602 Shortness of breath: Secondary | ICD-10-CM

## 2014-08-13 DIAGNOSIS — R000 Tachycardia, unspecified: Secondary | ICD-10-CM

## 2014-08-13 NOTE — Progress Notes (Signed)
   Subjective:    Patient ID: Catherine Pugh, female    DOB: 01/26/1966, 48 y.o.   MRN: [id number]  HPI Patient is here today for annual wellness exam. Patient states that she has her pap smear and breast exam done through her OB/GYN doctor. Explained to patient that this visit may have to be coded differently due to her already having a wellness exam done through her specialist.    Had bl work in 2014 but not 2015  Patient does bring in some blood work today.  When hiking [place], noticed rapid h r and sob.  Had difficulty with this.  Exercises walks thde dog a lot, [place], none recently with in law sickness  Was doing steps at work but got away from it because of hot flash.  No pain with incr heart rate  Nephew cong heart  Both parents odied of cancer,  occas cough but no hemoptysis  Some days after a soda gets some substernal discomfrot after certain foods  Work can be stressful.  BP numb generally good,  lowere than normal in the past  Smokes around five or seven cig.s per day  Know she needs to quit    Patient states she was climbing and had an experience where her heart was racing and she was short of breath. She states she is afraid of heights though.   She has been experiencing ringing in her ears also.    Review of Systems No true chest pain no headache no major abdominal pain no change in bowel habits no blood in stools some reflux symptoms later in the day often. No rash    Objective:   Physical Exam Alert no apparent distress. Vitals stable. HEENT normal. TMs normal. Facial neck supple. Lungs clear. Heart regular in rhythm. Slight epigastric tenderness to deep palpation. No masses no rebound no guarding excellent bowel sounds ankles without edema  EKG normal sinus rhythm no significant ST-T changes within normal limits.       Assessment & Plan:  Impression #1 exertional dyspnea may be related to deconditioning, however may be  related to #2 #2 chronic smoker for many years. No chronic cough. #3 exertional tachycardia. Highly doubt cardiac disorder. Rationale discussed with patient. Likely more related to one and 2 discussed #4 reflux discussed #5 blood work results Plan pulmonary function tests pre-and post treatment. Chest x-ray. Rationale discussed. Followup 2 weeks after PFTs done we'll discuss pulmonary function and in smoking cessation. WSL

## 2014-08-19 ENCOUNTER — Telehealth: Payer: Self-pay | Admitting: Family Medicine

## 2014-08-20 ENCOUNTER — Ambulatory Visit (HOSPITAL_COMMUNITY)
Admission: RE | Admit: 2014-08-20 | Discharge: 2014-08-20 | Disposition: A | Discharge status: Home / Self Care | Payer: 59 | Source: Ambulatory Visit | Attending: Family Medicine | Admitting: Family Medicine

## 2014-08-20 ENCOUNTER — Encounter (HOSPITAL_COMMUNITY): Payer: 59

## 2014-08-20 DIAGNOSIS — R0602 Shortness of breath: Secondary | ICD-10-CM | POA: Insufficient documentation

## 2014-08-20 LAB — PULMONARY FUNCTION TEST
FEF 25-75 PRE: 2.92 L/s
FEF 25-75 Post: 2.61 L/sec
FEF2575-%CHANGE-POST: -10 %
FEF2575-%PRED-POST: 94 %
FEF2575-%PRED-PRE: 105 %
FEV1-%CHANGE-POST: -2 %
FEV1-%PRED-PRE: 101 %
FEV1-%Pred-Post: 98 %
FEV1-POST: 2.81 L
FEV1-PRE: 2.88 L
FEV1FVC-%CHANGE-POST: 2 %
FEV1FVC-%PRED-PRE: 100 %
FEV6-%CHANGE-POST: -4 %
FEV6-%PRED-POST: 98 %
FEV6-%Pred-Pre: 102 %
FEV6-Post: 3.42 L
FEV6-Pre: 3.57 L
FEV6FVC-%Change-Post: 0 %
FEV6FVC-%Pred-Post: 103 %
FEV6FVC-%Pred-Pre: 102 %
FVC-%Change-Post: -4 %
FVC-%PRED-POST: 95 %
FVC-%Pred-Pre: 100 %
FVC-POST: 3.42 L
FVC-Pre: 3.6 L
POST FEV1/FVC RATIO: 82 %
Post FEV6/FVC ratio: 100 %
Pre FEV1/FVC ratio: 80 %
Pre FEV6/FVC Ratio: 99 %

## 2014-08-20 MED ORDER — ALBUTEROL SULFATE (2.5 MG/3ML) 0.083% IN NEBU
2.5000 mg | INHALATION_SOLUTION | Freq: Once | RESPIRATORY_TRACT | Status: AC
  Administered 2014-08-20: 2.5 mg via RESPIRATORY_TRACT

## 2014-08-20 NOTE — Telephone Encounter (Signed)
Made error wrong patient for phone message.

## 2014-08-27 ENCOUNTER — Encounter: Payer: Self-pay | Admitting: Family Medicine

## 2014-08-27 ENCOUNTER — Ambulatory Visit (INDEPENDENT_AMBULATORY_CARE_PROVIDER_SITE_OTHER): Payer: 59 | Admitting: Family Medicine

## 2014-08-27 VITALS — BP 110/70 | Ht 64.5 in | Wt 145.0 lb

## 2014-08-27 DIAGNOSIS — R0989 Other specified symptoms and signs involving the circulatory and respiratory systems: Secondary | ICD-10-CM

## 2014-08-27 DIAGNOSIS — R0609 Other forms of dyspnea: Secondary | ICD-10-CM

## 2014-08-27 DIAGNOSIS — R0600 Dyspnea, unspecified: Secondary | ICD-10-CM

## 2014-08-27 NOTE — Progress Notes (Signed)
   Subjective:    Patient ID: Malachi Pro, female    DOB: 02/12/1966, 48 y.o.   MRN: [id number]  HPI Patient is here today to discuss the results of her recent pulmonary function tests and chest xray. Patient states she is doing well and has not had any more episodes of dyspnea since her last incident.   Patient states she has no concerns at this time.    No further spells of major shortness of breath.  Review of Systems No headache no chest pain no back pain no abdominal pain no change in bowel habits    Objective:   Physical Exam Alert vitals stable. Lungs clear heart rare rhythm HET normal       Assessment & Plan:  Impression dyspnea results discuss x-ray normal. Pulmonary function tests read as normal but showed very early changes with FEV1 over FVC somewhat diminished. Discussed at length. Could be a result 2 mild smoking habit plan strongly encouraged to stop smoking. Primary source of dyspnea is lack of exercises so start! Followup as needed. WSL

## 2014-10-11 ENCOUNTER — Encounter: Payer: Self-pay | Admitting: Family Medicine

## 2015-08-31 IMAGING — CR DG CHEST 2V
2 series · 2 of 2 positions shown · non-contrast
Comparison: None.

CLINICAL DATA: Chest pain, intermittent shortness of breath over
the last few weeks, smoking history

EXAM:
CHEST  2 VIEW

[view not recorded (1 of 2)]
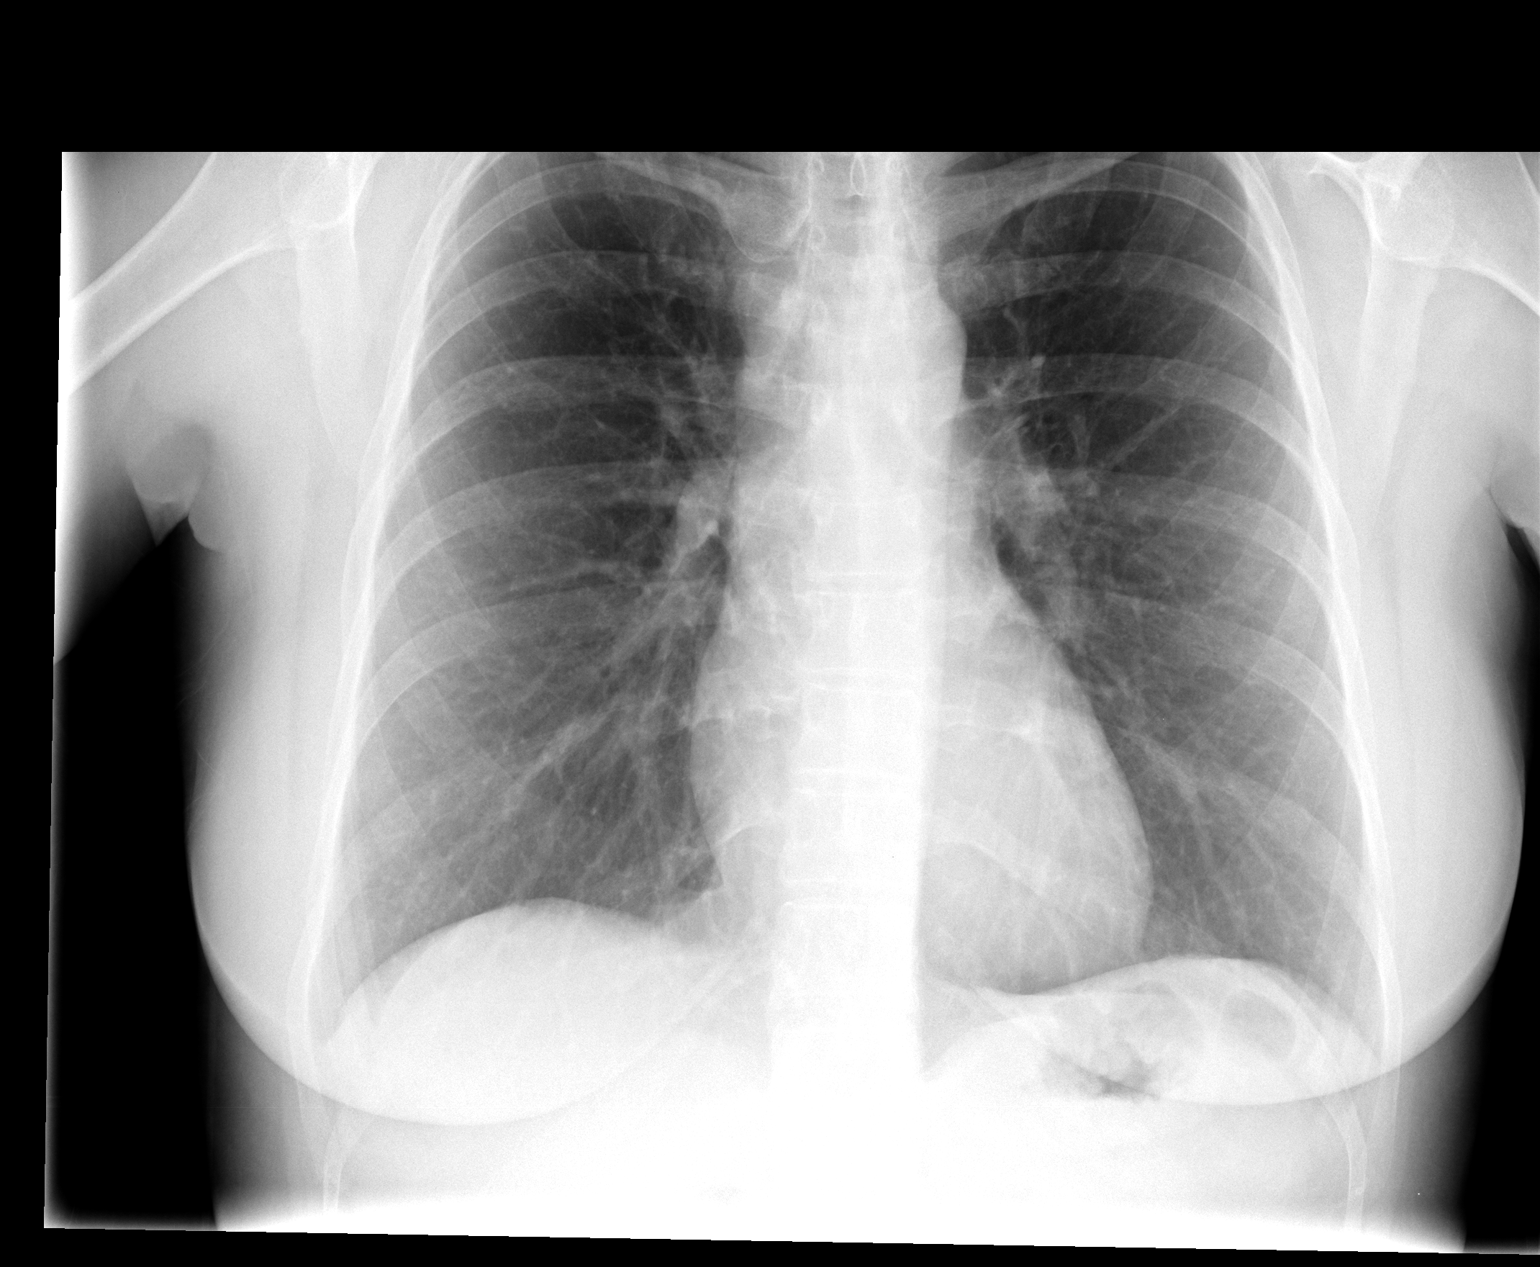

[view not recorded (2 of 2)]
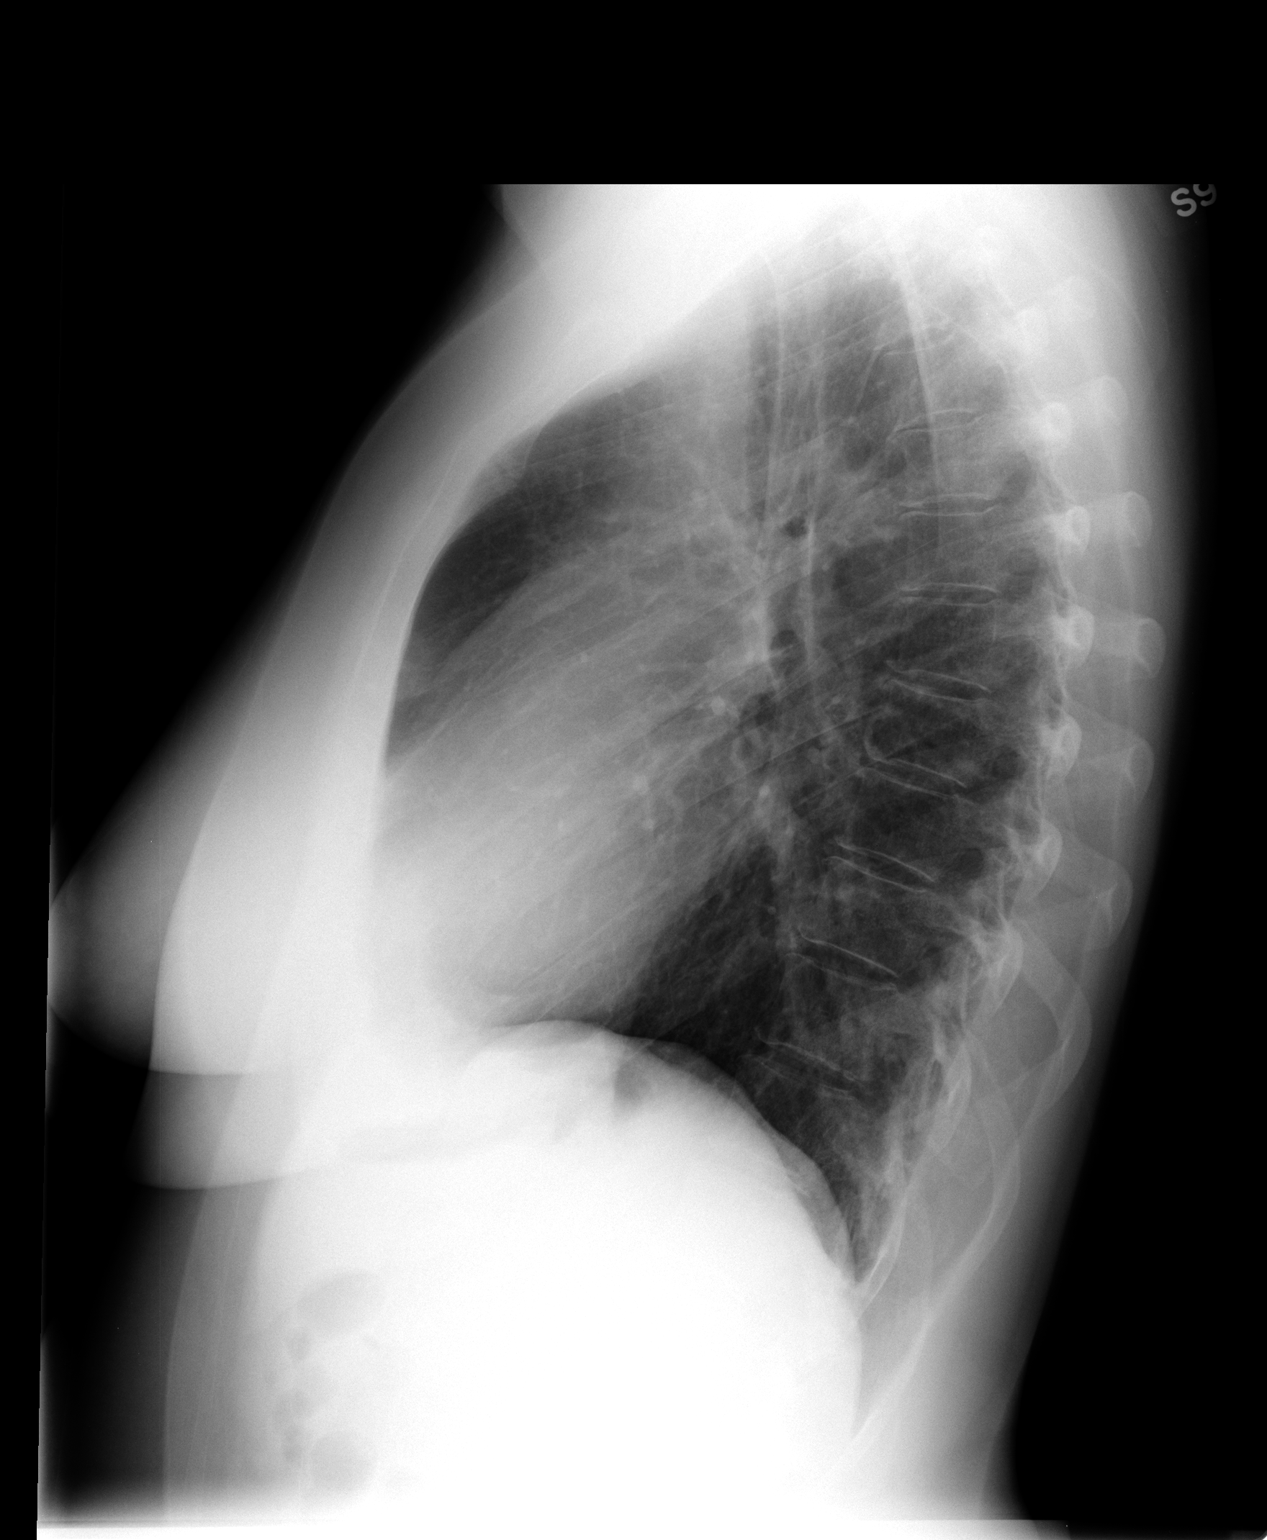

[2 of 2 positions shown; findings below may reference images not displayed]

FINDINGS: No active infiltrate or effusion is seen. Mild apical pleural
thickening is noted. Mediastinal and hilar contours are
unremarkable. The heart is within normal limits in size. No bony
abnormality is seen.
IMPRESSION: No active cardiopulmonary disease.

## 2015-09-06 ENCOUNTER — Other Ambulatory Visit: Payer: Self-pay

## 2015-09-06 DIAGNOSIS — Z1231 Encounter for screening mammogram for malignant neoplasm of breast: Secondary | ICD-10-CM

## 2015-09-14 ENCOUNTER — Ambulatory Visit: Payer: Self-pay

## 2015-09-20 ENCOUNTER — Ambulatory Visit
Admission: RE | Admit: 2015-09-20 | Discharge: 2015-09-20 | Disposition: A | Discharge status: Home / Self Care | Payer: Commercial Managed Care - HMO | Source: Ambulatory Visit

## 2015-09-20 DIAGNOSIS — Z1231 Encounter for screening mammogram for malignant neoplasm of breast: Secondary | ICD-10-CM

## 2015-11-16 ENCOUNTER — Ambulatory Visit (INDEPENDENT_AMBULATORY_CARE_PROVIDER_SITE_OTHER): Payer: Commercial Managed Care - HMO | Admitting: Gynecology

## 2015-11-16 ENCOUNTER — Encounter: Payer: Self-pay | Admitting: Gynecology

## 2015-11-16 VITALS — BP 120/74 | Ht 64.5 in | Wt 151.0 lb

## 2015-11-16 DIAGNOSIS — Z01419 Encounter for gynecological examination (general) (routine) without abnormal findings: Secondary | ICD-10-CM | POA: Diagnosis not present

## 2015-11-16 DIAGNOSIS — Z13220 Encounter for screening for lipoid disorders: Secondary | ICD-10-CM | POA: Diagnosis not present

## 2015-11-16 DIAGNOSIS — Z1329 Encounter for screening for other suspected endocrine disorder: Secondary | ICD-10-CM

## 2015-11-16 NOTE — Progress Notes (Signed)
Catherine Pugh 1966-07-31 [id number]        49 y.o.  G2P2  Patient's last menstrual period was 08/10/2012. for annual exam.  Doing well without complaints.  Past medical history,surgical history, problem list, medications, allergies, family history and social history were all reviewed and documented as reviewed in the EPIC chart.  ROS:  Performed with pertinent positives and negatives included in the history, assessment and plan.   Additional significant findings :  none   Exam: [facility] personassistant Filed Vitals:   11/16/15 1401  BP: 120/74  Height: 5' 4.5" (1.638 m)  Weight: 151 lb (68.493 kg)   General appearance:  Normal affect, orientation and appearance. Skin: Grossly normal HEENT: Without gross lesions.  No cervical or supraclavicular adenopathy. Thyroid normal.  Lungs:  Clear without wheezing, rales or rhonchi Cardiac: RR, without RMG Abdominal:  Soft, nontender, without masses, guarding, rebound, organomegaly or hernia Breasts:  Examined lying and sitting without masses, retractions, discharge or axillary adenopathy. Pelvic:  Ext/BUS/vagina normal  Cervix normal  Uterus retroverted, normal size, shape and contour, midline and mobile nontender   Adnexa  Without masses or tenderness    Anus and perineum  Normal   Rectovaginal  Normal sphincter tone without palpated masses or tenderness.    Assessment/Plan:  49 y.o. G2P2 female for annual exam.   1. Postmenopausal. Patient with some hot flashes but notes that are better over this past year. No significant sweats vaginal dryness or any vaginal bleeding. Continue to monitor and report any vaginal bleeding.  2. Pap smear/HPV 2014 negative. No Pap smear done today. History of ASCUS negative high-risk HPV 2012. Negative Pap smears afterwards. Plan repeat Pap smear in 3-5 year interval. 3. Mammography 09/2015. Continue with annual mammography when due. SBE monthly reviewed. 4. Colonoscopy never. Patient agrees to call and schedule  this coming year. Names and numbers provided. 5. Health maintenance. CBC, comprehensive metabolic panel, lipid profile, urinalysis, TSH ordered. Follow up in one year, sooner as needed.   Dara LordsFONTAINE,Glendel Jaggers P MD, 2:26 PM 11/16/2015

## 2015-11-16 NOTE — Patient Instructions (Signed)
Schedule your colonoscopy with either:  [facility]   Address: [address], [place], [place] [address]  [phone]    or  [facility]  Address: [address], [place], [place] [address]  [phone]      You may obtain a copy of any labs that were done today by logging onto MyChart as outlined in the instructions provided with your AVS (after visit summary). The office will not call with normal lab results but certainly if there are any significant abnormalities then we will contact you.   Health Maintenance Adopting a healthy lifestyle and getting preventive care can go a long way to promote health and wellness. Talk with your health care provider about what schedule of regular examinations is right for you. This is a good chance for you to check in with your provider about disease prevention and staying healthy. In between checkups, there are plenty of things you can do on your own. Experts have done a lot of research about which lifestyle changes and preventive measures are most likely to keep you healthy. Ask your health care provider for more information. WEIGHT AND DIET  Eat a healthy diet  Be sure to include plenty of vegetables, fruits, low-fat dairy products, and lean protein.  Do not eat a lot of foods high in solid fats, added sugars, or salt.  Get regular exercise. This is one of the most important things you can do for your health.  Most adults should exercise for at least 150 minutes each week. The exercise should increase your heart rate and make you sweat (moderate-intensity exercise).  Most adults should also do strengthening exercises at least twice a week. This is in addition to the moderate-intensity exercise.  Maintain a healthy weight  Body mass index (BMI) is a measurement that can be used to identify possible weight problems. It estimates body fat based on height and weight. Your health care provider can help determine  your BMI and help you achieve or maintain a healthy weight.  For females 22 years of age and older:   A BMI below 18.5 is considered underweight.  A BMI of 18.5 to 24.9 is normal.  A BMI of 25 to 29.9 is considered overweight.  A BMI of 30 and above is considered obese.  Watch levels of cholesterol and blood lipids  You should start having your blood tested for lipids and cholesterol at 51 years of age, then have this test every 5 years.  You may need to have your cholesterol levels checked more often if:  Your lipid or cholesterol levels are high.  You are older than 51 years of age.  You are at high risk for heart disease.  CANCER SCREENING   Lung Cancer  Lung cancer screening is recommended for adults 19-51 years old who are at high risk for lung cancer because of a history of smoking.  A yearly low-dose CT scan of the lungs is recommended for people who:  Currently smoke.  Have quit within the past 15 years.  Have at least a 30-pack-year history of smoking. A pack year is smoking an average of one pack of cigarettes a day for 1 year.  Yearly screening should continue until it has been 15 years since you quit.  Yearly screening should stop if you develop a health problem that would prevent you from having lung cancer treatment.  Breast Cancer  Practice breast self-awareness. This means understanding how your breasts normally appear and  feel.  It also means doing regular breast self-exams. Let your health care provider know about any changes, no matter how small.  If you are in your 20s or 30s, you should have a clinical breast exam (CBE) by a health care provider every 1-3 years as part of a regular health exam.  If you are 35 or older, have a CBE every year. Also consider having a breast X-ray (mammogram) every year.  If you have a family history of breast cancer, talk to your health care provider about genetic screening.  If you are at high risk for breast  cancer, talk to your health care provider about having an MRI and a mammogram every year.  Breast cancer gene (BRCA) assessment is recommended for women who have family members with BRCA-related cancers. BRCA-related cancers include:  Breast.  Ovarian.  Tubal.  Peritoneal cancers.  Results of the assessment will determine the need for genetic counseling and BRCA1 and BRCA2 testing. Cervical Cancer Routine pelvic examinations to screen for cervical cancer are no longer recommended for nonpregnant women who are considered low risk for cancer of the pelvic organs (ovaries, uterus, and vagina) and who do not have symptoms. A pelvic examination may be necessary if you have symptoms including those associated with pelvic infections. Ask your health care provider if a screening pelvic exam is right for you.   The Pap test is the screening test for cervical cancer for women who are considered at risk.  If you had a hysterectomy for a problem that was not cancer or a condition that could lead to cancer, then you no longer need Pap tests.  If you are older than 65 years, and you have had normal Pap tests for the past 10 years, you no longer need to have Pap tests.  If you have had past treatment for cervical cancer or a condition that could lead to cancer, you need Pap tests and screening for cancer for at least 20 years after your treatment.  If you no longer get a Pap test, assess your risk factors if they change (such as having a new sexual partner). This can affect whether you should start being screened again.  Some women have medical problems that increase their chance of getting cervical cancer. If this is the case for you, your health care provider may recommend more frequent screening and Pap tests.  The human papillomavirus (HPV) test is another test that may be used for cervical cancer screening. The HPV test looks for the virus that can cause cell changes in the cervix. The cells  collected during the Pap test can be tested for HPV.  The HPV test can be used to screen women 72 years of age and older. Getting tested for HPV can extend the interval between normal Pap tests from three to five years.  An HPV test also should be used to screen women of any age who have unclear Pap test results.  After 50 years of age, women should have HPV testing as often as Pap tests.  Colorectal Cancer  This type of cancer can be detected and often prevented.  Routine colorectal cancer screening usually begins at 51 years of age and continues through 51 years of age.  Your health care provider may recommend screening at an earlier age if you have risk factors for colon cancer.  Your health care provider may also recommend using home test kits to check for hidden blood in the stool.  A small camera  at the end of a tube can be used to examine your colon directly (sigmoidoscopy or colonoscopy). This is done to check for the earliest forms of colorectal cancer.  Routine screening usually begins at age 40.  Direct examination of the colon should be repeated every 5-10 years through 51 years of age. However, you may need to be screened more often if early forms of precancerous polyps or small growths are found. Skin Cancer  Check your skin from head to toe regularly.  Tell your health care provider about any new moles or changes in moles, especially if there is a change in a mole's shape or color.  Also tell your health care provider if you have a mole that is larger than the size of a pencil eraser.  Always use sunscreen. Apply sunscreen liberally and repeatedly throughout the day.  Protect yourself by wearing long sleeves, pants, a wide-brimmed hat, and sunglasses whenever you are outside. HEART DISEASE, DIABETES, AND HIGH BLOOD PRESSURE   Have your blood pressure checked at least every 1-2 years. High blood pressure causes heart disease and increases the risk of stroke.  If  you are between 74 years and 72 years old, ask your health care provider if you should take aspirin to prevent strokes.  Have regular diabetes screenings. This involves taking a blood sample to check your fasting blood sugar level.  If you are at a normal weight and have a low risk for diabetes, have this test once every three years after 51 years of age.  If you are overweight and have a high risk for diabetes, consider being tested at a younger age or more often. PREVENTING INFECTION  Hepatitis B  If you have a higher risk for hepatitis B, you should be screened for this virus. You are considered at high risk for hepatitis B if:  You were born in a country where hepatitis B is common. Ask your health care provider which countries are considered high risk.  Your parents were born in a high-risk country, and you have not been immunized against hepatitis B (hepatitis B vaccine).  You have HIV or AIDS.  You use needles to inject street drugs.  You live with someone who has hepatitis B.  You have had sex with someone who has hepatitis B.  You get hemodialysis treatment.  You take certain medicines for conditions, including cancer, organ transplantation, and autoimmune conditions. Hepatitis C  Blood testing is recommended for:  Everyone born from [birth year] through 1967.  Anyone with known risk factors for hepatitis C. Sexually transmitted infections (STIs)  You should be screened for sexually transmitted infections (STIs) including gonorrhea and chlamydia if:  You are sexually active and are younger than 51 years of age.  You are older than 51 years of age and your health care provider tells you that you are at risk for this type of infection.  Your sexual activity has changed since you were last screened and you are at an increased risk for chlamydia or gonorrhea. Ask your health care provider if you are at risk.  If you do not have HIV, but are at risk, it may be recommended that  you take a prescription medicine daily to prevent HIV infection. This is called pre-exposure prophylaxis (PrEP). You are considered at risk if:  You are sexually active and do not regularly use condoms or know the HIV status of your partner(s).  You take drugs by injection.  You are sexually active with a partner  who has HIV. Talk with your health care provider about whether you are at high risk of being infected with HIV. If you choose to begin PrEP, you should first be tested for HIV. You should then be tested every 3 months for as long as you are taking PrEP.  PREGNANCY   If you are premenopausal and you may become pregnant, ask your health care provider about preconception counseling.  If you may become pregnant, take 400 to 800 micrograms (mcg) of folic acid every day.  If you want to prevent pregnancy, talk to your health care provider about birth control (contraception). OSTEOPOROSIS AND MENOPAUSE   Osteoporosis is a disease in which the bones lose minerals and strength with aging. This can result in serious bone fractures. Your risk for osteoporosis can be identified using a bone density scan.  If you are 8 years of age or older, or if you are at risk for osteoporosis and fractures, ask your health care provider if you should be screened.  Ask your health care provider whether you should take a calcium or vitamin D supplement to lower your risk for osteoporosis.  Menopause may have certain physical symptoms and risks.  Hormone replacement therapy may reduce some of these symptoms and risks. Talk to your health care provider about whether hormone replacement therapy is right for you.  HOME CARE INSTRUCTIONS   Schedule regular health, dental, and eye exams.  Stay current with your immunizations.   Do not use any tobacco products including cigarettes, chewing tobacco, or electronic cigarettes.  If you are pregnant, do not drink alcohol.  If you are breastfeeding, limit how  much and how often you drink alcohol.  Limit alcohol intake to no more than 1 drink per day for nonpregnant women. One drink equals 12 ounces of beer, 5 ounces of wine, or 1 ounces of hard liquor.  Do not use street drugs.  Do not share needles.  Ask your health care provider for help if you need support or information about quitting drugs.  Tell your health care provider if you often feel depressed.  Tell your health care provider if you have ever been abused or do not feel safe at home. Document Released: 06/11/2011 Document Revised: 04/12/2014 Document Reviewed: 10/28/2013 [facility] Patient Information 2015 [place], [place]. This information is not intended to replace advice given to you by your health care provider. Make sure you discuss any questions you have with your health care provider.

## 2015-11-17 LAB — URINALYSIS W MICROSCOPIC + REFLEX CULTURE
Bilirubin Urine: NEGATIVE
Casts: NONE SEEN [LPF]
Crystals: NONE SEEN [HPF]
GLUCOSE, UA: NEGATIVE
Ketones, ur: NEGATIVE
NITRITE: NEGATIVE
PROTEIN: NEGATIVE
Specific Gravity, Urine: 1.013 (ref 1.001–1.035)
YEAST: NONE SEEN [HPF]
pH: 7 (ref 5.0–8.0)

## 2015-11-20 LAB — URINE CULTURE: Colony Count: >=100000

## 2015-11-24 ENCOUNTER — Other Ambulatory Visit: Payer: Self-pay | Admitting: Gynecology

## 2015-11-24 MED ORDER — SULFAMETHOXAZOLE-TRIMETHOPRIM 800-160 MG PO TABS: 1.0000 | ORAL_TABLET | Freq: Two times a day (BID) | ORAL | Status: DC

## 2015-12-16 ENCOUNTER — Telehealth: Payer: Self-pay | Admitting: *Deleted

## 2015-12-16 ENCOUNTER — Other Ambulatory Visit: Payer: Commercial Managed Care - HMO

## 2015-12-16 DIAGNOSIS — N390 Urinary tract infection, site not specified: Secondary | ICD-10-CM

## 2015-12-16 NOTE — Telephone Encounter (Signed)
FY Pt took Septra DS 1 by mouth twice a day 3 days on 11/24/15 would like to come back and urine rechecked to confirm infection is gone. Order placed.

## 2015-12-16 NOTE — Telephone Encounter (Signed)
okay

## 2015-12-17 LAB — URINALYSIS W MICROSCOPIC + REFLEX CULTURE
BILIRUBIN URINE: NEGATIVE
Bacteria, UA: NONE SEEN [HPF]
CASTS: NONE SEEN [LPF]
Crystals: NONE SEEN [HPF]
Glucose, UA: NEGATIVE
KETONES UR: NEGATIVE
NITRITE: NEGATIVE
PH: 7 (ref 5.0–8.0)
Protein, ur: NEGATIVE
Specific Gravity, Urine: 1.007 (ref 1.001–1.035)
Yeast: NONE SEEN [HPF]

## 2015-12-21 ENCOUNTER — Other Ambulatory Visit: Payer: Self-pay | Admitting: Gynecology

## 2015-12-21 LAB — URINE CULTURE

## 2015-12-21 MED ORDER — NITROFURANTOIN MONOHYD MACRO 100 MG PO CAPS: 100.0000 mg | ORAL_CAPSULE | Freq: Two times a day (BID) | ORAL | Status: DC

## 2016-08-29 ENCOUNTER — Telehealth: Payer: Self-pay | Admitting: Family Medicine

## 2016-08-29 DIAGNOSIS — Z139 Encounter for screening, unspecified: Secondary | ICD-10-CM

## 2016-08-29 NOTE — Telephone Encounter (Signed)
Spoke with patient and informed her per Dr.Steve Luking- Labs for upcoming appointment was ordered. Informed patient to be fasting prior to having labs drawn. Patient verbalized understanding.

## 2016-08-29 NOTE — Telephone Encounter (Signed)
Requesting order for blood work for upcoming appointment on 09/03/16.  She is hoping to have this done tomorrow.

## 2016-08-29 NOTE — Telephone Encounter (Signed)
If she wants geernal screning cbc met 7 tsh lip liv

## 2016-08-31 LAB — HEPATIC FUNCTION PANEL
ALBUMIN: 4.3 g/dL (ref 3.5–5.5)
ALK PHOS: 75 IU/L (ref 39–117)
ALT: 12 IU/L (ref 0–32)
AST: 16 IU/L (ref 0–40)
BILIRUBIN TOTAL: 0.4 mg/dL (ref 0.0–1.2)
BILIRUBIN, DIRECT: 0.1 mg/dL (ref 0.00–0.40)
TOTAL PROTEIN: 6.3 g/dL (ref 6.0–8.5)

## 2016-08-31 LAB — CBC WITH DIFFERENTIAL/PLATELET
BASOS ABS: 0 10*3/uL (ref 0.0–0.2)
Basos: 1 %
EOS (ABSOLUTE): 0.1 10*3/uL (ref 0.0–0.4)
Eos: 2 %
Hematocrit: 42.2 % (ref 34.0–46.6)
Hemoglobin: 14.1 g/dL (ref 11.1–15.9)
IMMATURE GRANS (ABS): 0 10*3/uL (ref 0.0–0.1)
Immature Granulocytes: 0 %
LYMPHS: 31 %
Lymphocytes Absolute: 1.8 10*3/uL (ref 0.7–3.1)
MCH: 31.7 pg (ref 26.6–33.0)
MCHC: 33.4 g/dL (ref 31.5–35.7)
MCV: 95 fL (ref 79–97)
Monocytes Absolute: 0.4 10*3/uL (ref 0.1–0.9)
Monocytes: 7 %
NEUTROS ABS: 3.4 10*3/uL (ref 1.4–7.0)
Neutrophils: 59 %
PLATELETS: 315 10*3/uL (ref 150–379)
RBC: 4.45 x10E6/uL (ref 3.77–5.28)
RDW: 14.4 % (ref 12.3–15.4)
WBC: 5.8 10*3/uL (ref 3.4–10.8)

## 2016-08-31 LAB — BASIC METABOLIC PANEL
BUN / CREAT RATIO: 10 (ref 9–23)
BUN: 8 mg/dL (ref 6–24)
CHLORIDE: 106 mmol/L (ref 96–106)
CO2: 25 mmol/L (ref 18–29)
Calcium: 9.3 mg/dL (ref 8.7–10.2)
Creatinine, Ser: 0.82 mg/dL (ref 0.57–1.00)
GFR calc Af Amer: 95 mL/min/{1.73_m2} (ref >59)
GFR calc non Af Amer: 83 mL/min/{1.73_m2} (ref >59)
GLUCOSE: 85 mg/dL (ref 65–99)
POTASSIUM: 4.4 mmol/L (ref 3.5–5.2)
SODIUM: 145 mmol/L — AB (ref 134–144)

## 2016-08-31 LAB — LIPID PANEL
Chol/HDL Ratio: 3.3 ratio units (ref 0.0–4.4)
Cholesterol, Total: 218 mg/dL — ABNORMAL HIGH (ref 100–199)
HDL: 66 mg/dL (ref >39)
LDL Calculated: 120 mg/dL — ABNORMAL HIGH (ref 0–99)
Triglycerides: 162 mg/dL — ABNORMAL HIGH (ref 0–149)
VLDL Cholesterol Cal: 32 mg/dL (ref 5–40)

## 2016-08-31 LAB — TSH: TSH: 7.29 u[IU]/mL — AB (ref 0.450–4.500)

## 2016-09-03 ENCOUNTER — Encounter: Payer: Self-pay | Admitting: Family Medicine

## 2016-09-03 ENCOUNTER — Ambulatory Visit (INDEPENDENT_AMBULATORY_CARE_PROVIDER_SITE_OTHER): Payer: Commercial Managed Care - HMO | Admitting: Family Medicine

## 2016-09-03 VITALS — BP 118/76 | Ht 65.0 in | Wt 160.0 lb

## 2016-09-03 DIAGNOSIS — Z0000 Encounter for general adult medical examination without abnormal findings: Secondary | ICD-10-CM | POA: Diagnosis not present

## 2016-09-03 DIAGNOSIS — E039 Hypothyroidism, unspecified: Secondary | ICD-10-CM | POA: Diagnosis not present

## 2016-09-03 DIAGNOSIS — Z23 Encounter for immunization: Secondary | ICD-10-CM | POA: Diagnosis not present

## 2016-09-03 DIAGNOSIS — Z1211 Encounter for screening for malignant neoplasm of colon: Secondary | ICD-10-CM | POA: Diagnosis not present

## 2016-09-03 MED ORDER — LEVOTHYROXINE SODIUM 50 MCG PO TABS: 50.0000 ug | ORAL_TABLET | Freq: Every day | ORAL | 3 refills | Status: DC

## 2016-09-03 NOTE — Progress Notes (Signed)
Subjective:    Patient ID: Catherine Pugh, female    DOB: 04-06-1966, 50 y.o.   MRN: [id number]  HPI The patient comes in today for a wellness visit.  Pt has physicals with gyn.   A review of their health history was completed.  A review of medications was also completed.  Any needed refills; does not take any meds.   Eating habits: could do better.   Falls/  MVA accidents in past few months: none  Regular exercise: not on a regular basis  Specialist pt sees on regular basis: none  Preventative health issues were discussed.   Additional concerns: weight gain, go over results of bloodwork.   Wants flu vaccine today.   Pt wants to do screening colonoscopy. Info sheet given and referral put in. Pt wants to see dr Claudie Leachrehaman.  Results for orders placed or performed in visit on 08/29/16  CBC with Differential/Platelet  Result Value Ref Range   WBC 5.8 3.4 - 10.8 x10E3/uL   RBC 4.45 3.77 - 5.28 x10E6/uL   Hemoglobin 14.1 11.1 - 15.9 g/dL   Hematocrit 44.042.2 34.734.0 - 46.6 %   MCV 95 79 - 97 fL   MCH 31.7 26.6 - 33.0 pg   MCHC 33.4 31.5 - 35.7 g/dL   RDW 42.514.4 95.612.3 - 38.715.4 %   Platelets 315 150 - 379 x10E3/uL   Neutrophils 59 %   Lymphs 31 %   Monocytes 7 %   Eos 2 %   Basos 1 %   Neutrophils Absolute 3.4 1.4 - 7.0 x10E3/uL   Lymphocytes Absolute 1.8 0.7 - 3.1 x10E3/uL   Monocytes Absolute 0.4 0.1 - 0.9 x10E3/uL   EOS (ABSOLUTE) 0.1 0.0 - 0.4 x10E3/uL   Basophils Absolute 0.0 0.0 - 0.2 x10E3/uL   Immature Granulocytes 0 %   Immature Grans (Abs) 0.0 0.0 - 0.1 x10E3/uL  Basic metabolic panel  Result Value Ref Range   Glucose 85 65 - 99 mg/dL   BUN 8 6 - 24 mg/dL   Creatinine, Ser 5.640.82 0.57 - 1.00 mg/dL   GFR calc non Af Amer 83 >59 mL/min/1.73   GFR calc Af Amer 95 >59 mL/min/1.73   BUN/Creatinine Ratio 10 9 - 23   Sodium 145 (H) 134 - 144 mmol/L   Potassium 4.4 3.5 - 5.2 mmol/L   Chloride 106 96 - 106 mmol/L   CO2 25 18 - 29 mmol/L   Calcium 9.3 8.7 - 10.2 mg/dL    TSH  Result Value Ref Range   TSH 7.290 (H) 0.450 - 4.500 uIU/mL  Lipid panel  Result Value Ref Range   Cholesterol, Total 218 (H) 100 - 199 mg/dL   Triglycerides 332162 (H) 0 - 149 mg/dL   HDL 66 >95>39 mg/dL   VLDL Cholesterol Cal 32 5 - 40 mg/dL   LDL Calculated 188120 (H) 0 - 99 mg/dL   Chol/HDL Ratio 3.3 0.0 - 4.4 ratio units  Hepatic function panel  Result Value Ref Range   Total Protein 6.3 6.0 - 8.5 g/dL   Albumin 4.3 3.5 - 5.5 g/dL   Bilirubin Total 0.4 0.0 - 1.2 mg/dL   Bilirubin, Direct 4.160.10 0.00 - 0.40 mg/dL   Alkaline Phosphatase 75 39 - 117 IU/L   AST 16 0 - 40 IU/L   ALT 12 0 - 32 IU/L   Dr Gita Kudofontaigne every other yer or so, not pap day  Pt ws taking baby aspirin and taking be cause heard it was healthy  Review of Systems  Constitutional: Negative for activity change, appetite change and fatigue.  HENT: Negative for congestion, ear discharge and rhinorrhea.   Eyes: Negative for discharge.  Respiratory: Negative for cough, chest tightness and wheezing.   Cardiovascular: Negative for chest pain.  Gastrointestinal: Negative for abdominal pain and vomiting.  Genitourinary: Negative for difficulty urinating and frequency.  Musculoskeletal: Negative for neck pain.  Allergic/Immunologic: Negative for environmental allergies and food allergies.  Neurological: Negative for weakness and headaches.  Psychiatric/Behavioral: Negative for agitation and behavioral problems.  All other systems reviewed and are negative.      Objective:   Physical Exam  Constitutional: She is oriented to person, place, and time. She appears well-developed and well-nourished.  HENT:  Head: Normocephalic.  Right Ear: External ear normal.  Left Ear: External ear normal.  Eyes: Pupils are equal, round, and reactive to light.  Neck: Normal range of motion. No thyromegaly present.  Cardiovascular: Normal rate, regular rhythm, normal heart sounds and intact distal pulses.   No murmur  heard. Pulmonary/Chest: Effort normal and breath sounds normal. No respiratory distress. She has no wheezes.  Abdominal: Soft. Bowel sounds are normal. She exhibits no distension and no mass. There is no tenderness.  Musculoskeletal: Normal range of motion. She exhibits no edema or tenderness.  Lymphadenopathy:    She has no cervical adenopathy.  Neurological: She is alert and oriented to person, place, and time. She exhibits normal muscle tone.  Skin: Skin is warm and dry.  Psychiatric: She has a normal mood and affect. Her behavior is normal.  Vitals reviewed.         Assessment & Plan:  Impression well adult exam blood work reviewed. #2 hypothyroidism new diagnosis. Accompanied by fatigue and excessive weight gain. Follow-up positive family history. Plan GI referral for colonoscopy. Diet exercise discussed in encourage. Initiate levothyroid. Recheck blood work again 6-9 weeks from now. Flu shot WSL

## 2016-09-03 NOTE — Patient Instructions (Signed)
Back Exercises The following exercises strengthen the muscles that help to support the back. They also help to keep the lower back flexible. Doing these exercises can help to prevent back pain or lessen existing pain. If you have back pain or discomfort, try doing these exercises 2-3 times each day or as told by your health care provider. When the pain goes away, do them once each day, but increase the number of times that you repeat the steps for each exercise (do more repetitions). If you do not have back pain or discomfort, do these exercises once each day or as told by your health care provider. EXERCISES Single Knee to Chest Repeat these steps 3-5 times for each leg: 1. Lie on your back on a firm bed or the floor with your legs extended. 2. Bring one knee to your chest. Your other leg should stay extended and in contact with the floor. 3. Hold your knee in place by grabbing your knee or thigh. 4. Pull on your knee until you feel a gentle stretch in your lower back. 5. Hold the stretch for 10-30 seconds. 6. Slowly release and straighten your leg. Pelvic Tilt Repeat these steps 5-10 times: 1. Lie on your back on a firm bed or the floor with your legs extended. 2. Bend your knees so they are pointing toward the ceiling and your feet are flat on the floor. 3. Tighten your lower abdominal muscles to press your lower back against the floor. This motion will tilt your pelvis so your tailbone points up toward the ceiling instead of pointing to your feet or the floor. 4. With gentle tension and even breathing, hold this position for 5-10 seconds. Cat-Cow Repeat these steps until your lower back becomes more flexible: 1. Get into a hands-and-knees position on a firm surface. Keep your hands under your shoulders, and keep your knees under your hips. You may place padding under your knees for comfort. 2. Let your head hang down, and point your tailbone toward the floor so your lower back becomes  rounded like the back of a cat. 3. Hold this position for 5 seconds. 4. Slowly lift your head and point your tailbone up toward the ceiling so your back forms a sagging arch like the back of a cow. 5. Hold this position for 5 seconds. Press-Ups Repeat these steps 5-10 times: 1. Lie on your abdomen (face-down) on the floor. 2. Place your palms near your head, about shoulder-width apart. 3. While you keep your back as relaxed as possible and keep your hips on the floor, slowly straighten your arms to raise the top half of your body and lift your shoulders. Do not use your back muscles to raise your upper torso. You may adjust the placement of your hands to make yourself more comfortable. 4. Hold this position for 5 seconds while you keep your back relaxed. 5. Slowly return to lying flat on the floor. Bridges Repeat these steps 10 times: 1. Lie on your back on a firm surface. 2. Bend your knees so they are pointing toward the ceiling and your feet are flat on the floor. 3. Tighten your buttocks muscles and lift your buttocks off of the floor until your waist is at almost the same height as your knees. You should feel the muscles working in your buttocks and the back of your thighs. If you do not feel these muscles, slide your feet 1-2 inches farther away from your buttocks. 4. Hold this position for 3-5   seconds. 5. Slowly lower your hips to the starting position, and allow your buttocks muscles to relax completely. If this exercise is too easy, try doing it with your arms crossed over your chest. Abdominal Crunches Repeat these steps 5-10 times: 1. Lie on your back on a firm bed or the floor with your legs extended. 2. Bend your knees so they are pointing toward the ceiling and your feet are flat on the floor. 3. Cross your arms over your chest. 4. Tip your chin slightly toward your chest without bending your neck. 5. Tighten your abdominal muscles and slowly raise your trunk (torso) high  enough to lift your shoulder blades a tiny bit off of the floor. Avoid raising your torso higher than that, because it can put too much stress on your low back and it does not help to strengthen your abdominal muscles. 6. Slowly return to your starting position. Back Lifts Repeat these steps 5-10 times: 1. Lie on your abdomen (face-down) with your arms at your sides, and rest your forehead on the floor. 2. Tighten the muscles in your legs and your buttocks. 3. Slowly lift your chest off of the floor while you keep your hips pressed to the floor. Keep the back of your head in line with the curve in your back. Your eyes should be looking at the floor. 4. Hold this position for 3-5 seconds. 5. Slowly return to your starting position. SEEK MEDICAL CARE IF:  Your back pain or discomfort gets much worse when you do an exercise.  Your back pain or discomfort does not lessen within 2 hours after you exercise. If you have any of these problems, stop doing these exercises right away. Do not do them again unless your health care provider says that you can. SEEK IMMEDIATE MEDICAL CARE IF:  You develop sudden, severe back pain. If this happens, stop doing the exercises right away. Do not do them again unless your health care provider says that you can.   This information is not intended to replace advice given to you by your health care provider. Make sure you discuss any questions you have with your health care provider.   Document Released: 01/03/2005 Document Revised: 08/17/2015 Document Reviewed: 01/20/2015 Elsevier Interactive Patient Education 2016 [employer]. Results for orders placed or performed in visit on 08/29/16  CBC with Differential/Platelet  Result Value Ref Range   WBC 5.8 3.4 - 10.8 x10E3/uL   RBC 4.45 3.77 - 5.28 x10E6/uL   Hemoglobin 14.1 11.1 - 15.9 g/dL   Hematocrit 40.9 81.1 - 46.6 %   MCV 95 79 - 97 fL   MCH 31.7 26.6 - 33.0 pg   MCHC 33.4 31.5 - 35.7 g/dL   RDW 91.4  78.2 - 95.6 %   Platelets 315 150 - 379 x10E3/uL   Neutrophils 59 %   Lymphs 31 %   Monocytes 7 %   Eos 2 %   Basos 1 %   Neutrophils Absolute 3.4 1.4 - 7.0 x10E3/uL   Lymphocytes Absolute 1.8 0.7 - 3.1 x10E3/uL   Monocytes Absolute 0.4 0.1 - 0.9 x10E3/uL   EOS (ABSOLUTE) 0.1 0.0 - 0.4 x10E3/uL   Basophils Absolute 0.0 0.0 - 0.2 x10E3/uL   Immature Granulocytes 0 %   Immature Grans (Abs) 0.0 0.0 - 0.1 x10E3/uL  Basic metabolic panel  Result Value Ref Range   Glucose 85 65 - 99 mg/dL   BUN 8 6 - 24 mg/dL   Creatinine, Ser 2.13 0.57 -  1.00 mg/dL   GFR calc non Af Amer 83 >59 mL/min/1.73   GFR calc Af Amer 95 >59 mL/min/1.73   BUN/Creatinine Ratio 10 9 - 23   Sodium 145 (H) 134 - 144 mmol/L   Potassium 4.4 3.5 - 5.2 mmol/L   Chloride 106 96 - 106 mmol/L   CO2 25 18 - 29 mmol/L   Calcium 9.3 8.7 - 10.2 mg/dL  TSH  Result Value Ref Range   TSH 7.290 (H) 0.450 - 4.500 uIU/mL  Lipid panel  Result Value Ref Range   Cholesterol, Total 218 (H) 100 - 199 mg/dL   Triglycerides 161162 (H) 0 - 149 mg/dL   HDL 66 >09>39 mg/dL   VLDL Cholesterol Cal 32 5 - 40 mg/dL   LDL Calculated 604120 (H) 0 - 99 mg/dL   Chol/HDL Ratio 3.3 0.0 - 4.4 ratio units  Hepatic function panel  Result Value Ref Range   Total Protein 6.3 6.0 - 8.5 g/dL   Albumin 4.3 3.5 - 5.5 g/dL   Bilirubin Total 0.4 0.0 - 1.2 mg/dL   Bilirubin, Direct 5.400.10 0.00 - 0.40 mg/dL   Alkaline Phosphatase 75 39 - 117 IU/L   AST 16 0 - 40 IU/L   ALT 12 0 - 32 IU/L

## 2016-09-04 ENCOUNTER — Encounter (INDEPENDENT_AMBULATORY_CARE_PROVIDER_SITE_OTHER): Payer: Self-pay | Admitting: *Deleted

## 2017-01-02 ENCOUNTER — Telehealth: Payer: Self-pay | Admitting: Family Medicine

## 2017-01-02 DIAGNOSIS — E039 Hypothyroidism, unspecified: Secondary | ICD-10-CM

## 2017-01-02 DIAGNOSIS — Z1211 Encounter for screening for malignant neoplasm of colon: Secondary | ICD-10-CM

## 2017-01-02 NOTE — Telephone Encounter (Signed)
Patient was in 09/03/16 for physical and was told to get labs done in 6 months for thyroid check. She wanted to know if she needs to come in.She wants to get her colon checked also wanting us to setup appointment with Dr. Karilyn Cotaehman.

## 2017-01-03 NOTE — Telephone Encounter (Signed)
tsh  Only no need for ov, colon ref to dr Karilyn Cotarehman

## 2017-01-03 NOTE — Telephone Encounter (Signed)
Order for tsh put in and referral put in for dr Karilyn Cotarehman. Pt notified.

## 2017-01-04 ENCOUNTER — Encounter: Payer: Self-pay | Admitting: Family Medicine

## 2017-01-07 ENCOUNTER — Encounter (INDEPENDENT_AMBULATORY_CARE_PROVIDER_SITE_OTHER): Payer: Self-pay | Admitting: *Deleted

## 2017-01-16 DIAGNOSIS — E039 Hypothyroidism, unspecified: Secondary | ICD-10-CM | POA: Diagnosis not present

## 2017-01-17 LAB — TSH: TSH: 0.785 u[IU]/mL (ref 0.450–4.500)

## 2017-03-25 ENCOUNTER — Ambulatory Visit (INDEPENDENT_AMBULATORY_CARE_PROVIDER_SITE_OTHER): Payer: Commercial Managed Care - HMO | Admitting: Family Medicine

## 2017-03-25 ENCOUNTER — Encounter: Payer: Self-pay | Admitting: Family Medicine

## 2017-03-25 VITALS — Temp 98.1°F | Ht 65.0 in | Wt 143.8 lb

## 2017-03-25 DIAGNOSIS — B349 Viral infection, unspecified: Secondary | ICD-10-CM

## 2017-03-25 DIAGNOSIS — J019 Acute sinusitis, unspecified: Secondary | ICD-10-CM

## 2017-03-25 DIAGNOSIS — B9689 Other specified bacterial agents as the cause of diseases classified elsewhere: Secondary | ICD-10-CM

## 2017-03-25 MED ORDER — OLOPATADINE HCL 0.1 % OP SOLN: 1.0000 [drp] | Freq: Two times a day (BID) | OPHTHALMIC | 12 refills | Status: DC

## 2017-03-25 MED ORDER — AMOXICILLIN-POT CLAVULANATE 875-125 MG PO TABS: 1.0000 | ORAL_TABLET | Freq: Two times a day (BID) | ORAL | 0 refills | Status: DC

## 2017-03-25 NOTE — Progress Notes (Signed)
   Subjective:    Patient ID: Catherine Pugh, female    DOB: 07-Mar-1966, 51 y.o.   MRN: [id number]  Cough  This is a new problem. The current episode started in the past 7 days. Associated symptoms include nasal congestion and rhinorrhea. Pertinent negatives include no chest pain, ear pain, fever, shortness of breath or wheezing. Treatments tried: dimetap.   Viral like illness for the past few days head congestion drainage coughing no wheezing or difficulty breathing   Review of Systems  Constitutional: Negative for activity change and fever.  HENT: Positive for congestion and rhinorrhea. Negative for ear pain.   Eyes: Negative for discharge.  Respiratory: Positive for cough. Negative for shortness of breath and wheezing.   Cardiovascular: Negative for chest pain.       Objective:   Physical Exam  Constitutional: She appears well-developed.  HENT:  Head: Normocephalic.  Nose: Nose normal.  Mouth/Throat: Oropharynx is clear and moist. No oropharyngeal exudate.  Neck: Neck supple.  Cardiovascular: Normal rate and normal heart sounds.   No murmur heard. Pulmonary/Chest: Effort normal and breath sounds normal. She has no wheezes.  Lymphadenopathy:    She has no cervical adenopathy.  Skin: Skin is warm and dry.  Nursing note and vitals reviewed.         Assessment & Plan:  Sinusitis antibiotics prescribed warning signs discussed Allergy drops for the eyes prescribed Follow-up if progressive troubles

## 2017-07-27 ENCOUNTER — Other Ambulatory Visit: Payer: Self-pay | Admitting: Family Medicine

## 2017-09-04 DIAGNOSIS — Z23 Encounter for immunization: Secondary | ICD-10-CM | POA: Diagnosis not present

## 2017-09-13 ENCOUNTER — Other Ambulatory Visit: Payer: Self-pay | Admitting: Family Medicine

## 2017-09-13 ENCOUNTER — Ambulatory Visit
Admission: RE | Admit: 2017-09-13 | Discharge: 2017-09-13 | Disposition: A | Discharge status: Home / Self Care | Payer: Commercial Managed Care - HMO | Source: Ambulatory Visit | Attending: Family Medicine | Admitting: Family Medicine

## 2017-09-13 DIAGNOSIS — Z1231 Encounter for screening mammogram for malignant neoplasm of breast: Secondary | ICD-10-CM

## 2017-09-23 ENCOUNTER — Encounter: Payer: Self-pay | Admitting: Family Medicine

## 2017-09-23 ENCOUNTER — Ambulatory Visit (INDEPENDENT_AMBULATORY_CARE_PROVIDER_SITE_OTHER): Payer: 59 | Admitting: Family Medicine

## 2017-09-23 VITALS — BP 112/64 | Temp 98.1°F | Ht 65.0 in | Wt 150.5 lb

## 2017-09-23 DIAGNOSIS — J019 Acute sinusitis, unspecified: Secondary | ICD-10-CM | POA: Diagnosis not present

## 2017-09-23 DIAGNOSIS — B9689 Other specified bacterial agents as the cause of diseases classified elsewhere: Secondary | ICD-10-CM | POA: Diagnosis not present

## 2017-09-23 MED ORDER — AMOXICILLIN-POT CLAVULANATE 875-125 MG PO TABS: 1.0000 | ORAL_TABLET | Freq: Two times a day (BID) | ORAL | 0 refills | Status: AC

## 2017-09-23 NOTE — Progress Notes (Signed)
   Subjective:    Patient ID: Catherine Pugh, female    DOB: 09-28-1966, 51 y.o.   MRN: [id number]  Cough  This is a new problem. The current episode started in the past 7 days. Associated symptoms include headaches, rhinorrhea and a sore throat. Treatments tried: Dimetapp, Sudafed,    Sore throat last Sunday, not around anyone else sick  Got the flu shot at work   Some exposure to some sick   Dimetapp prn cong and erange    Has moved into the chest  All in the head earlier , frontal , sharu[ tend , worse with motion, cough persisting, productive   Patient states no other concerns this visit.   Review of Systems  HENT: Positive for rhinorrhea and sore throat.   Respiratory: Positive for cough.   Neurological: Positive for headaches.       Objective:   Physical Exam Alert, mild malaise. Hydration good Vitals stable. frontal/ maxillary tenderness evident positive nasal congestion. pharynx normal neck supple  lungs clear/no crackles or wheezes. heart regular in rhythm        Assessment & Plan:  Impression rhinosinusitis likely post viral, discussed with patient. plan antibiotics prescribed. Questions answered. Symptomatic care discussed. warning signs discussed. WSL

## 2017-09-30 ENCOUNTER — Other Ambulatory Visit: Payer: Self-pay | Admitting: Family Medicine

## 2017-10-04 ENCOUNTER — Telehealth: Payer: Self-pay | Admitting: *Deleted

## 2017-10-04 ENCOUNTER — Telehealth: Payer: Self-pay | Admitting: Family Medicine

## 2017-10-04 DIAGNOSIS — Z79899 Other long term (current) drug therapy: Secondary | ICD-10-CM

## 2017-10-04 DIAGNOSIS — E039 Hypothyroidism, unspecified: Secondary | ICD-10-CM

## 2017-10-04 DIAGNOSIS — R5383 Other fatigue: Secondary | ICD-10-CM

## 2017-10-04 DIAGNOSIS — E785 Hyperlipidemia, unspecified: Secondary | ICD-10-CM

## 2017-10-04 NOTE — Telephone Encounter (Signed)
Patient had Lipid, liver, tsh, met 7 and cbc 08/2016

## 2017-10-04 NOTE — Telephone Encounter (Signed)
Patient has not had chronic visit in over a year. Patient scheduled office visit for chronic meds.

## 2017-10-04 NOTE — Telephone Encounter (Signed)
Patient called wanting a nurse to return her call regarding the refills on her thyroid medication and how often does she need to come in for an office visit to get this refilled. Please advise [phone]

## 2017-10-04 NOTE — Telephone Encounter (Signed)
Patient has an appointment on 10/22/17 with Dr. Brett CanalesSteve.  She wants to know if she is due for labs?

## 2017-10-06 NOTE — Telephone Encounter (Signed)
Repeat same 

## 2017-10-07 NOTE — Telephone Encounter (Signed)
Left message to return call 

## 2017-10-07 NOTE — Telephone Encounter (Signed)
Pt.notified

## 2017-10-07 NOTE — Telephone Encounter (Signed)
Orders are put in. Left message to return call

## 2017-10-11 DIAGNOSIS — E039 Hypothyroidism, unspecified: Secondary | ICD-10-CM | POA: Diagnosis not present

## 2017-10-11 DIAGNOSIS — Z79899 Other long term (current) drug therapy: Secondary | ICD-10-CM | POA: Diagnosis not present

## 2017-10-11 DIAGNOSIS — E785 Hyperlipidemia, unspecified: Secondary | ICD-10-CM | POA: Diagnosis not present

## 2017-10-12 LAB — CBC WITH DIFFERENTIAL/PLATELET
BASOS ABS: 0 10*3/uL (ref 0.0–0.2)
Basos: 1 %
EOS (ABSOLUTE): 0.2 10*3/uL (ref 0.0–0.4)
Eos: 3 %
HEMOGLOBIN: 14.1 g/dL (ref 11.1–15.9)
Hematocrit: 42.8 % (ref 34.0–46.6)
IMMATURE GRANS (ABS): 0 10*3/uL (ref 0.0–0.1)
IMMATURE GRANULOCYTES: 0 %
Lymphocytes Absolute: 1.7 10*3/uL (ref 0.7–3.1)
Lymphs: 34 %
MCH: 31.3 pg (ref 26.6–33.0)
MCHC: 32.9 g/dL (ref 31.5–35.7)
MCV: 95 fL (ref 79–97)
Monocytes Absolute: 0.5 10*3/uL (ref 0.1–0.9)
Monocytes: 11 %
NEUTROS PCT: 51 %
Neutrophils Absolute: 2.6 10*3/uL (ref 1.4–7.0)
Platelets: 291 10*3/uL (ref 150–379)
RBC: 4.51 x10E6/uL (ref 3.77–5.28)
RDW: 14.3 % (ref 12.3–15.4)
WBC: 5 10*3/uL (ref 3.4–10.8)

## 2017-10-12 LAB — HEPATIC FUNCTION PANEL
ALBUMIN: 4.4 g/dL (ref 3.5–5.5)
ALT: 22 IU/L (ref 0–32)
AST: 20 IU/L (ref 0–40)
Alkaline Phosphatase: 79 IU/L (ref 39–117)
Bilirubin Total: 0.3 mg/dL (ref 0.0–1.2)
Bilirubin, Direct: 0.09 mg/dL (ref 0.00–0.40)
TOTAL PROTEIN: 6.6 g/dL (ref 6.0–8.5)

## 2017-10-12 LAB — BASIC METABOLIC PANEL
BUN/Creatinine Ratio: 15 (ref 9–23)
BUN: 11 mg/dL (ref 6–24)
CHLORIDE: 103 mmol/L (ref 96–106)
CO2: 24 mmol/L (ref 20–29)
CREATININE: 0.73 mg/dL (ref 0.57–1.00)
Calcium: 9.4 mg/dL (ref 8.7–10.2)
GFR calc Af Amer: 109 mL/min/{1.73_m2} (ref >59)
GFR calc non Af Amer: 94 mL/min/{1.73_m2} (ref >59)
GLUCOSE: 85 mg/dL (ref 65–99)
Potassium: 4.3 mmol/L (ref 3.5–5.2)
SODIUM: 141 mmol/L (ref 134–144)

## 2017-10-12 LAB — LIPID PANEL
Chol/HDL Ratio: 3 ratio (ref 0.0–4.4)
Cholesterol, Total: 226 mg/dL — ABNORMAL HIGH (ref 100–199)
HDL: 76 mg/dL (ref >39)
LDL CALC: 124 mg/dL — AB (ref 0–99)
Triglycerides: 132 mg/dL (ref 0–149)
VLDL Cholesterol Cal: 26 mg/dL (ref 5–40)

## 2017-10-12 LAB — TSH: TSH: 2.55 u[IU]/mL (ref 0.450–4.500)

## 2017-10-22 ENCOUNTER — Ambulatory Visit: Payer: 59 | Admitting: Family Medicine

## 2017-10-22 ENCOUNTER — Encounter: Payer: Self-pay | Admitting: Family Medicine

## 2017-10-22 VITALS — BP 122/82 | Ht 65.0 in | Wt 153.8 lb

## 2017-10-22 DIAGNOSIS — M7552 Bursitis of left shoulder: Secondary | ICD-10-CM

## 2017-10-22 DIAGNOSIS — E039 Hypothyroidism, unspecified: Secondary | ICD-10-CM

## 2017-10-22 DIAGNOSIS — M7712 Lateral epicondylitis, left elbow: Secondary | ICD-10-CM | POA: Diagnosis not present

## 2017-10-22 MED ORDER — LEVOTHYROXINE SODIUM 75 MCG PO TABS: 75.0000 ug | ORAL_TABLET | Freq: Every day | ORAL | 11 refills | Status: DC

## 2017-10-22 NOTE — Patient Instructions (Addendum)
Range of motion exrcises twice per day, couple minutes  ++++++++++++++++++++++++++++++++++++++++++++++++++++++++++++++++++++++++++++++++++++++++++++++++++++++++++++++++++ Tennis Elbow Tennis elbow (lateral epicondylitis) is inflammation of the outer tendons of your forearm close to your elbow. Your tendons attach your muscles to your bones. The outer tendons of your forearm are used to extend your wrist, and they attach on the outside part of your elbow. Tennis elbow is often found in people who play tennis, but anyone may get the condition from repeatedly extending the wrist or turning the forearm. What are the causes? This condition is caused by repeatedly extending your wrist and using your hands. It can result from sports or work that requires repetitive forearm movements. Tennis elbow may also be caused by an injury. What increases the risk? You have a higher risk of developing tennis elbow if you play tennis or another racquet sport. You also have a higher risk if you frequently use your hands for work. This condition is also more likely to develop in:  Musicians.  Carpenters, painters, and plumbers.  Cooks.  Cashiers.  People who work in [employer].  Holiday representativeConstruction workers.  Butchers.  People who use computers.  What are the signs or symptoms? Symptoms of this condition include:  Pain and tenderness in your forearm and the outer part of your elbow. You may only feel the pain when you use your arm, or you may feel it even when you are not using your arm.  A burning feeling that runs from your elbow through your arm.  Weak grip in your hands.  How is this diagnosed? This condition may be diagnosed by medical history and physical exam. You may also have other tests, including:  X-rays.  MRI.  How is this treated? Your health care provider will recommend lifestyle adjustments, such as resting and icing your arm. Treatment may also include:  Medicines for inflammation.  This may include shots of cortisone if your pain continues.  Physical therapy. This may include massage or exercises.  An elbow brace.  Surgery may eventually be recommended if your pain does not go away with treatment. Follow these instructions at home: Activity  Rest your elbow and wrist as directed by your health care provider. Try to avoid any activities that caused the problem until your health care provider says that you can do them again.  If a physical therapist teaches you exercises, do all of them as directed.  If you lift an object, lift it with your palm facing upward. This lowers the stress on your elbow. Lifestyle  If your tennis elbow is caused by sports, check your equipment and make sure that: ? You are using it correctly. ? It is the best fit for you.  If your tennis elbow is caused by work, take breaks frequently, if you are able. Talk with your manager about how to best perform tasks in a way that is safe. ? If your tennis elbow is caused by computer use, talk with your manager about any changes that can be made to your work environment. General instructions  If directed, apply ice to the painful area: ? Put ice in a plastic bag. ? Place a towel between your skin and the bag. ? Leave the ice on for 20 minutes, 2-3 times per day.  Take medicines only as directed by your health care provider.  If you were given a brace, wear it as directed by your health care provider.  Keep all follow-up visits as directed by your health care provider. This is important.  Contact a health care provider if:  Your pain does not get better with treatment.  Your pain gets worse.  You have numbness or weakness in your forearm, hand, or fingers. This information is not intended to replace advice given to you by your health care provider. Make sure you discuss any questions you have with your health care provider. Document Released: 11/26/2005 Document Revised: 07/26/2016  Document Reviewed: 11/22/2014 Elsevier Interactive Patient Education  2018 [employer]   [place] two tabs twice per day with food    Forearm velcro strap otc at car apoth

## 2017-10-22 NOTE — Progress Notes (Signed)
Subjective:    Patient ID: Catherine Pugh, female    DOB: November 14, 1966, 51 y.o.   MRN: [id number] Patient presents with multiple concerns HPI Patient arrives for a yearly follow up on thyroid. Patient had blood work and would like to discuss results.   Patient also reports left arm pain.left shoulder and elbow pain  tok no med for shoulder     Worse with certain motions over the head  Hurting over the past mont  Pt recalls no injury   Compliant with thyroid does not miss a dose, pos fam hx of thyroid problems     Results for orders placed or performed in visit on 10/04/17  Lipid panel  Result Value Ref Range   Cholesterol, Total 226 (H) 100 - 199 mg/dL   Triglycerides 409132 0 - 149 mg/dL   HDL 76 >81>39 mg/dL   VLDL Cholesterol Cal 26 5 - 40 mg/dL   LDL Calculated 191124 (H) 0 - 99 mg/dL   Chol/HDL Ratio 3.0 0.0 - 4.4 ratio  Hepatic function panel  Result Value Ref Range   Total Protein 6.6 6.0 - 8.5 g/dL   Albumin 4.4 3.5 - 5.5 g/dL   Bilirubin Total 0.3 0.0 - 1.2 mg/dL   Bilirubin, Direct 4.780.09 0.00 - 0.40 mg/dL   Alkaline Phosphatase 79 39 - 117 IU/L   AST 20 0 - 40 IU/L   ALT 22 0 - 32 IU/L  TSH  Result Value Ref Range   TSH 2.550 0.450 - 4.500 uIU/mL  Basic metabolic panel  Result Value Ref Range   Glucose 85 65 - 99 mg/dL   BUN 11 6 - 24 mg/dL   Creatinine, Ser 2.950.73 0.57 - 1.00 mg/dL   GFR calc non Af Amer 94 >59 mL/min/1.73   GFR calc Af Amer 109 >59 mL/min/1.73   BUN/Creatinine Ratio 15 9 - 23   Sodium 141 134 - 144 mmol/L   Potassium 4.3 3.5 - 5.2 mmol/L   Chloride 103 96 - 106 mmol/L   CO2 24 20 - 29 mmol/L   Calcium 9.4 8.7 - 10.2 mg/dL  CBC with Differential/Platelet  Result Value Ref Range   WBC 5.0 3.4 - 10.8 x10E3/uL   RBC 4.51 3.77 - 5.28 x10E6/uL   Hemoglobin 14.1 11.1 - 15.9 g/dL   Hematocrit 62.142.8 30.834.0 - 46.6 %   MCV 95 79 - 97 fL   MCH 31.3 26.6 - 33.0 pg   MCHC 32.9 31.5 - 35.7 g/dL   RDW 65.714.3 84.612.3 - 96.215.4 %   Platelets 291 150 - 379  x10E3/uL   Neutrophils 51 Not Estab. %   Lymphs 34 Not Estab. %   Monocytes 11 Not Estab. %   Eos 3 Not Estab. %   Basos 1 Not Estab. %   Neutrophils Absolute 2.6 1.4 - 7.0 x10E3/uL   Lymphocytes Absolute 1.7 0.7 - 3.1 x10E3/uL   Monocytes Absolute 0.5 0.1 - 0.9 x10E3/uL   EOS (ABSOLUTE) 0.2 0.0 - 0.4 x10E3/uL   Basophils Absolute 0.0 0.0 - 0.2 x10E3/uL   Immature Granulocytes 0 Not Estab. %   Immature Grans (Abs) 0.0 0.0 - 0.1 x10E3/uL      Review of Systems No headache, no major weight loss or weight gain, no chest pain no back pain abdominal pain no change in bowel habits complete ROS otherwise negative     Objective:   Physical Exam Alert and oriented, vitals reviewed and stable, NAD ENT-TM's and ext canals WNL bilat  via otoscopic exam Soft palate, tonsils and post pharynx WNL via oropharyngeal exam Neck-symmetric, no masses; thyroid nonpalpable and nontender Pulmonary-no tachypnea or accessory muscle use; Clear without wheezes via auscultation Card--no abnrml murmurs, rhythm reg and rate WNL Carotid pulses symmetric, without bruits   Thyroid nonpalpable  Left shoulder good range of motion.  Pain with extension.  Distinct deltoid tenderness.  Left lateral elbow tenderness condyle impression 1      Assessment & Plan:  Left lateral epicondylitis discussed warm strep recommended Aleve 2 tabs twice daily.  Left shoulder bursitis.  Codman's exercises refilled.  Maintain Aleve.  Rationale discussed  Hypothyroidism.  Control good patient would like titer TSH.  Pros and cons discussed.  Will increase levothyroid patient needs to be checked just 3 months  Hypercholesterolemia discussed HDL very strong overall numbers reassuring discussed

## 2017-10-28 ENCOUNTER — Ambulatory Visit: Payer: 59 | Admitting: Gynecology

## 2017-10-28 ENCOUNTER — Encounter: Payer: Self-pay | Admitting: Gynecology

## 2017-10-28 VITALS — BP 124/78 | Ht 65.0 in | Wt 154.0 lb

## 2017-10-28 DIAGNOSIS — N952 Postmenopausal atrophic vaginitis: Secondary | ICD-10-CM

## 2017-10-28 DIAGNOSIS — Z01411 Encounter for gynecological examination (general) (routine) with abnormal findings: Secondary | ICD-10-CM | POA: Diagnosis not present

## 2017-10-28 DIAGNOSIS — Z1151 Encounter for screening for human papillomavirus (HPV): Secondary | ICD-10-CM | POA: Diagnosis not present

## 2017-10-28 NOTE — Progress Notes (Signed)
    Catherine Pugh 09/20/1966 [id number]        51 y.o.  G2P2 for annual gynecologic exam.  Doing well without gynecologic complaints  Past medical history,surgical history, problem list, medications, allergies, family history and social history were all reviewed and documented as reviewed in the EPIC chart.  ROS:  Performed with pertinent positives and negatives included in the history, assessment and plan.   Additional significant findings : None   Exam: Kennon PortelaKim Pugh assistant Vitals:   10/28/17 1004  BP: 124/78  Weight: 154 lb (69.9 kg)  Height: 5\' 5"  (1.651 m)   Body mass index is 25.63 kg/m.  General appearance:  Normal affect, orientation and appearance. Skin: Grossly normal HEENT: Without gross lesions.  No cervical or supraclavicular adenopathy. Thyroid normal.  Lungs:  Clear without wheezing, rales or rhonchi Cardiac: RR, without RMG Abdominal:  Soft, nontender, without masses, guarding, rebound, organomegaly or hernia Breasts:  Examined lying and sitting without masses, retractions, discharge or axillary adenopathy. Pelvic:  Ext, BUS, Vagina: With mild atrophic changes  Cervix: Normal.  Pap smear/HPV  Uterus: Anteverted, normal size, shape and contour, midline and mobile nontender   Adnexa: Without masses or tenderness    Anus and perineum: Normal   Rectovaginal: Normal sphincter tone without palpated masses or tenderness.    Assessment/Plan:  51 y.o. G2P2 female for annual gynecologic exam.   1. Postmenopausal/mild atrophic changes.  No significant hot flushes, night sweats, vaginal dryness or any vaginal bleeding.  Continue to monitor and report any issues or bleeding. 2. Pap smear/HPV 12/2012.  Pap smear/HPV today.  History of ASCUS negative high risk HPV 2012 with normal Pap smears otherwise. 3. Mammography 09/2017.  Continue with annual mammography next year.  Breast exam normal today.  SBE monthly reviewed. 4. Colonoscopy never.  Patient promises to put this  on her list this coming year. 5. Health maintenance.  No routine lab work done as patient has this done elsewhere.  Follow-up 1 year, sooner as needed.   Dara Lordsimothy P Catherine Lodico MD, 10:27 AM 10/28/2017

## 2017-10-28 NOTE — Patient Instructions (Signed)
Schedule your colonoscopy with either:  [facility]   Address: [address], [place], [place] [address]  [phone]    or  [facility]  Address: [address], [place], [place] [address]  [phone]      

## 2017-10-28 NOTE — Addendum Note (Signed)
Addended by: Dayna BarkerGARDNER, Janiyla Long K on: 10/28/2017 10:45 AM   Modules accepted: Orders

## 2017-10-30 ENCOUNTER — Other Ambulatory Visit (INDEPENDENT_AMBULATORY_CARE_PROVIDER_SITE_OTHER): Payer: Self-pay | Admitting: *Deleted

## 2017-10-30 DIAGNOSIS — Z1211 Encounter for screening for malignant neoplasm of colon: Secondary | ICD-10-CM

## 2017-10-30 LAB — URINALYSIS W MICROSCOPIC + REFLEX CULTURE
BACTERIA UA: NONE SEEN /HPF
Bilirubin Urine: NEGATIVE
GLUCOSE, UA: NEGATIVE
Hyaline Cast: NONE SEEN /LPF
KETONES UR: NEGATIVE
Nitrites, Initial: NEGATIVE
Protein, ur: NEGATIVE
RBC / HPF: NONE SEEN /HPF (ref 0–2)
Specific Gravity, Urine: 1.005 (ref 1.001–1.03)
pH: 7 (ref 5.0–8.0)

## 2017-10-30 LAB — URINE CULTURE
MICRO NUMBER:: 81308151
SPECIMEN QUALITY:: ADEQUATE

## 2017-10-30 LAB — CULTURE INDICATED

## 2017-11-01 LAB — PAP IG AND HPV HIGH-RISK: HPV DNA High Risk: NOT DETECTED

## 2018-01-13 ENCOUNTER — Telehealth (INDEPENDENT_AMBULATORY_CARE_PROVIDER_SITE_OTHER): Payer: Self-pay | Admitting: *Deleted

## 2018-01-13 ENCOUNTER — Telehealth (INDEPENDENT_AMBULATORY_CARE_PROVIDER_SITE_OTHER): Payer: Self-pay | Admitting: Internal Medicine

## 2018-01-13 NOTE — Telephone Encounter (Signed)
Referring MD/PCP: steve luking   Procedure: tcs  Reason/Indication:  screening  Has patient had this procedure before?  no  If so, when, by whom and where?    Is there a family history of colon cancer?  no  Who?  What age when diagnosed?    Is patient diabetic?   no      Does patient have prosthetic heart valve or mechanical valve?  no  Do you have a pacemaker?  no  Has patient ever had endocarditis? no  Has patient had joint replacement within last 12 months?  no  Is patient constipated or do they take laxatives? no  Does patient have a history of alcohol/drug use?  no  Is patient on blood thinner such as Coumadin, Plavix and/or Aspirin? no  Medications: aleve prn, levothyroxine 75 mcg daily  Allergies: nkd  Medication Adjustment per Dr Keane Policeehman/Terri Setzer, NP:   Procedure date & time: 02/12/18 at 830

## 2018-01-13 NOTE — Telephone Encounter (Signed)
Error  .Catherine Pugh

## 2018-01-14 NOTE — Telephone Encounter (Signed)
agree

## 2018-01-16 ENCOUNTER — Encounter (INDEPENDENT_AMBULATORY_CARE_PROVIDER_SITE_OTHER): Payer: Self-pay | Admitting: *Deleted

## 2018-01-16 ENCOUNTER — Telehealth (INDEPENDENT_AMBULATORY_CARE_PROVIDER_SITE_OTHER): Payer: Self-pay | Admitting: *Deleted

## 2018-01-16 MED ORDER — PEG 3350-KCL-NA BICARB-NACL 420 G PO SOLR: 4000.0000 mL | Freq: Once | ORAL | 0 refills | Status: AC

## 2018-01-16 NOTE — Telephone Encounter (Signed)
Patient needs trilyte 

## 2018-01-31 DIAGNOSIS — H00021 Hordeolum internum right upper eyelid: Secondary | ICD-10-CM | POA: Diagnosis not present

## 2018-02-12 ENCOUNTER — Other Ambulatory Visit: Payer: Self-pay

## 2018-02-12 ENCOUNTER — Ambulatory Visit (HOSPITAL_COMMUNITY)
Admission: RE | Admit: 2018-02-12 | Discharge: 2018-02-12 | Disposition: A | Discharge status: Home / Self Care | Payer: 59 | Source: Ambulatory Visit | Attending: Internal Medicine | Admitting: Internal Medicine

## 2018-02-12 ENCOUNTER — Encounter (HOSPITAL_COMMUNITY): Payer: Self-pay | Admitting: *Deleted

## 2018-02-12 ENCOUNTER — Encounter (HOSPITAL_COMMUNITY)
Admission: RE | Disposition: A | Discharge status: Home / Self Care | Payer: Self-pay | Source: Ambulatory Visit | Attending: Internal Medicine

## 2018-02-12 DIAGNOSIS — Z87891 Personal history of nicotine dependence: Secondary | ICD-10-CM | POA: Diagnosis not present

## 2018-02-12 DIAGNOSIS — K648 Other hemorrhoids: Secondary | ICD-10-CM | POA: Insufficient documentation

## 2018-02-12 DIAGNOSIS — Z79890 Hormone replacement therapy (postmenopausal): Secondary | ICD-10-CM | POA: Insufficient documentation

## 2018-02-12 DIAGNOSIS — Z1211 Encounter for screening for malignant neoplasm of colon: Secondary | ICD-10-CM | POA: Insufficient documentation

## 2018-02-12 DIAGNOSIS — Z79899 Other long term (current) drug therapy: Secondary | ICD-10-CM | POA: Insufficient documentation

## 2018-02-12 DIAGNOSIS — E039 Hypothyroidism, unspecified: Secondary | ICD-10-CM | POA: Insufficient documentation

## 2018-02-12 DIAGNOSIS — K5730 Diverticulosis of large intestine without perforation or abscess without bleeding: Secondary | ICD-10-CM | POA: Insufficient documentation

## 2018-02-12 HISTORY — DX: Hypothyroidism, unspecified: E03.9

## 2018-02-12 HISTORY — PX: COLONOSCOPY: SHX5424

## 2018-02-12 LAB — HEMOGLOBIN AND HEMATOCRIT, BLOOD
HCT: 39.2 % (ref 36.0–46.0)
Hemoglobin: 12.5 g/dL (ref 12.0–15.0)

## 2018-02-12 SURGERY — COLONOSCOPY
Anesthesia: Moderate Sedation

## 2018-02-12 MED ORDER — SODIUM CHLORIDE 0.9 % IV SOLN
INTRAVENOUS | Status: DC
  Administered 2018-02-12: 1000 mL via INTRAVENOUS

## 2018-02-12 MED ORDER — MIDAZOLAM HCL 5 MG/5ML IJ SOLN
INTRAMUSCULAR | Status: AC
  Filled 2018-02-12: qty 10

## 2018-02-12 MED ORDER — STERILE WATER FOR IRRIGATION IR SOLN
Status: DC | PRN
  Administered 2018-02-12: 08:00:00

## 2018-02-12 MED ORDER — MEPERIDINE HCL 50 MG/ML IJ SOLN
INTRAMUSCULAR | Status: DC | PRN
  Administered 2018-02-12 (×2): 25 mg via INTRAVENOUS

## 2018-02-12 MED ORDER — MEPERIDINE HCL 50 MG/ML IJ SOLN
INTRAMUSCULAR | Status: AC
  Filled 2018-02-12: qty 1

## 2018-02-12 MED ORDER — MIDAZOLAM HCL 5 MG/5ML IJ SOLN
INTRAMUSCULAR | Status: DC | PRN
  Administered 2018-02-12 (×2): 2 mg via INTRAVENOUS
  Administered 2018-02-12 (×3): 1 mg via INTRAVENOUS

## 2018-02-12 NOTE — H&P (Signed)
Catherine Pugh is an 52 y.o. female.   Chief Complaint: Patient is here for colonoscopy. HPI: Patient is 52 year old Caucasian female who is here for screening colonoscopy.  She denies abdominal pain change in bowel habits or rectal bleeding. History is negative for CRC.  Past Medical History:  Diagnosis Date  . ASCUS (atypical squamous cells of undetermined significance) on Pap smear 2012  . Hypothyroidism   . Thyroid disease    Hypothyroid    History reviewed. No pertinent surgical history.  Family History  Problem Relation Age of Onset  . Ovarian cancer Maternal Aunt   . Diabetes Cousin   . Cancer Mother        sarcoma  . Lung cancer Father    Social History:  reports that she quit smoking about 21 months ago. Her smoking use included cigarettes. She has a 6.00 pack-year smoking history. she has never used smokeless tobacco. She reports that she drinks alcohol. She reports that she does not use drugs.  Allergies: No Known Allergies  Medications Prior to Admission  Medication Sig Dispense Refill  . levothyroxine (SYNTHROID, LEVOTHROID) 75 MCG tablet Take 1 tablet (75 mcg total) daily by mouth. 30 tablet 11  . naproxen sodium (ALEVE) 220 MG tablet Take 440 mg by mouth daily as needed (for pain or headache).    Marland Kitchen. olopatadine (PATANOL) 0.1 % ophthalmic solution Place 1 drop into both eyes 2 (two) times daily. (Patient not taking: Reported on 02/03/2018) 5 mL 12    No results found for this or any previous visit (from the past 48 hour(s)). No results found.  ROS  Blood pressure 116/82, pulse 71, temperature 97.8 F (36.6 C), temperature source Oral, resp. rate 13, last menstrual period 08/10/2012, SpO2 100 %. Physical Exam  Constitutional: She appears well-developed and well-nourished.  HENT:  Mouth/Throat: Oropharynx is clear and moist.  Eyes: Conjunctivae are normal. No scleral icterus.  Neck: No thyromegaly present.  Cardiovascular: Normal rate, regular rhythm and  normal heart sounds.  No murmur heard. Respiratory: Effort normal and breath sounds normal.  GI: Soft. She exhibits no distension and no mass. There is no tenderness.  Musculoskeletal: She exhibits no edema.  Lymphadenopathy:    She has no cervical adenopathy.  Neurological: She is alert.  Skin: Skin is warm and dry.     Assessment/Plan Average risk screening colonoscopy.  Lionel DecemberNajeeb Shandra Szymborski, MD 02/12/2018, 8:13 AM

## 2018-02-12 NOTE — Discharge Instructions (Signed)
Resume usual medications as before. °High-fiber diet. °No driving for 24 hours. °Next screening exam in 10 years. ° ° °Colonoscopy, Adult, Care After °This sheet gives you information about how to care for yourself after your procedure. Your doctor may also give you more specific instructions. If you have problems or questions, call your doctor. °Follow these instructions at home: °General instructions ° °· For the first 24 hours after the procedure: °? Do not drive or use machinery. °? Do not sign important documents. °? Do not drink alcohol. °? Do your daily activities more slowly than normal. °? Eat foods that are soft and easy to digest. °? Rest often. °· Take over-the-counter or prescription medicines only as told by your doctor. °· It is up to you to get the results of your procedure. Ask your doctor, or the department performing the procedure, when your results will be ready. °To help cramping and bloating: °· Try walking around. °· Put heat on your belly (abdomen) as told by your doctor. Use a heat source that your doctor recommends, such as a moist heat pack or a heating pad. °? Put a towel between your skin and the heat source. °? Leave the heat on for 20-30 minutes. °? Remove the heat if your skin turns bright red. This is especially important if you cannot feel pain, heat, or cold. You can get burned. °Eating and drinking °· Drink enough fluid to keep your pee (urine) clear or pale yellow. °· Return to your normal diet as told by your doctor. Avoid heavy or fried foods that are hard to digest. °· Avoid drinking alcohol for as long as told by your doctor. °Contact a doctor if: °· You have blood in your poop (stool) 2-3 days after the procedure. °Get help right away if: °· You have more than a small amount of blood in your poop. °· You see large clumps of tissue (blood clots) in your poop. °· Your belly is swollen. °· You feel sick to your stomach (nauseous). °· You throw up (vomit). °· You have a  fever. °· You have belly pain that gets worse, and medicine does not help your pain. °This information is not intended to replace advice given to you by your health care provider. Make sure you discuss any questions you have with your health care provider. °Document Released: 12/29/2010 Document Revised: 08/20/2016 Document Reviewed: 08/20/2016 °Elsevier Interactive Patient Education © 2017 [employer] ° °Hemorrhoids °Hemorrhoids are swollen veins in and around the rectum or anus. There are two types of hemorrhoids: °· Internal hemorrhoids. These occur in the veins that are just inside the rectum. They may poke through to the outside and become irritated and painful. °· External hemorrhoids. These occur in the veins that are outside of the anus and can be felt as a painful swelling or hard lump near the anus. ° °Most hemorrhoids do not cause serious problems, and they can be managed with home treatments such as diet and lifestyle changes. If home treatments do not help your symptoms, procedures can be done to shrink or remove the hemorrhoids. °What are the causes? °This condition is caused by increased pressure in the anal area. This pressure may result from various things, including: °· Constipation. °· Straining to have a bowel movement. °· Diarrhea. °· Pregnancy. °· Obesity. °· Sitting for long periods of time. °· Heavy lifting or other activity that causes you to strain. °· Anal sex. ° °What are the signs or symptoms? °Symptoms of this   condition include: °· Pain. °· Anal itching or irritation. °· Rectal bleeding. °· Leakage of stool (feces). °· Anal swelling. °· One or more lumps around the anus. ° °How is this diagnosed? °This condition can often be diagnosed through a visual exam. Other exams or tests may also be done, such as: °· Examination of the rectal area with a gloved hand (digital rectal exam). °· Examination of the anal canal using a small tube (anoscope). °· A blood test, if you have lost a  significant amount of blood. °· A test to look inside the colon (sigmoidoscopy or colonoscopy). ° °How is this treated? °This condition can usually be treated at home. However, various procedures may be done if dietary changes, lifestyle changes, and other home treatments do not help your symptoms. These procedures can help make the hemorrhoids smaller or remove them completely. Some of these procedures involve surgery, and others do not. Common procedures include: °· Rubber band ligation. Rubber bands are placed at the base of the hemorrhoids to cut off the blood supply to them. °· Sclerotherapy. Medicine is injected into the hemorrhoids to shrink them. °· Infrared coagulation. A type of light energy is used to get rid of the hemorrhoids. °· Hemorrhoidectomy surgery. The hemorrhoids are surgically removed, and the veins that supply them are tied off. °· Stapled hemorrhoidopexy surgery. A circular stapling device is used to remove the hemorrhoids and use staples to cut off the blood supply to them. ° °Follow these instructions at home: °Eating and drinking °· Eat foods that have a lot of fiber in them, such as whole grains, beans, nuts, fruits, and vegetables. Ask your health care provider about taking products that have added fiber (fiber supplements). °· Drink enough fluid to keep your urine clear or pale yellow. °Managing pain and swelling °· Take warm sitz baths for 20 minutes, 3-4 times a day to ease pain and discomfort. °· If directed, apply ice to the affected area. Using ice packs between sitz baths may be helpful. °? Put ice in a plastic bag. °? Place a towel between your skin and the bag. °? Leave the ice on for 20 minutes, 2-3 times a day. °General instructions °· Take over-the-counter and prescription medicines only as told by your health care provider. °· Use medicated creams or suppositories as told. °· Exercise regularly. °· Go to the bathroom when you have the urge to have a bowel movement. Do not  wait. °· Avoid straining to have bowel movements. °· Keep the anal area dry and clean. Use wet toilet paper or moist towelettes after a bowel movement. °· Do not sit on the toilet for long periods of time. This increases blood pooling and pain. °Contact a health care provider if: °· You have increasing pain and swelling that are not controlled by treatment or medicine. °· You have uncontrolled bleeding. °· You have difficulty having a bowel movement, or you are unable to have a bowel movement. °· You have pain or inflammation outside the area of the hemorrhoids. °This information is not intended to replace advice given to you by your health care provider. Make sure you discuss any questions you have with your health care provider. °Document Released: 11/23/2000 Document Revised: 04/25/2016 Document Reviewed: 08/10/2015 °Elsevier Interactive Patient Education © 2018 Elsevier Inc. ° °Diverticulosis °Diverticulosis is a condition that develops when small pouches (diverticula) form in the wall of the large intestine (colon). The colon is where water is absorbed and stool is formed. The pouches form   when the inside layer of the colon pushes through weak spots in the outer layers of the colon. You may have a few pouches or many of them. °What are the causes? °The cause of this condition is not known. °What increases the risk? °The following factors may make you more likely to develop this condition: °· Being older than age 60. Your risk for this condition increases with age. Diverticulosis is rare among people younger than age 30. By age 80, many people have it. °· Eating a low-fiber diet. °· Having frequent constipation. °· Being overweight. °· Not getting enough exercise. °· Smoking. °· Taking over-the-counter pain medicines, like aspirin and ibuprofen. °· Having a family history of diverticulosis. ° °What are the signs or symptoms? °In most people, there are no symptoms of this condition. If you do have symptoms, they  may include: °· Bloating. °· Cramps in the abdomen. °· Constipation or diarrhea. °· Pain in the lower left side of the abdomen. ° °How is this diagnosed? °This condition is most often diagnosed during an exam for other colon problems. Because diverticulosis usually has no symptoms, it often cannot be diagnosed independently. This condition may be diagnosed by: °· Using a flexible scope to examine the colon (colonoscopy). °· Taking an X-ray of the colon after dye has been put into the colon (barium enema). °· Doing a CT scan. ° °How is this treated? °You may not need treatment for this condition if you have never developed an infection related to diverticulosis. If you have had an infection before, treatment may include: °· Eating a high-fiber diet. This may include eating more fruits, vegetables, and grains. °· Taking a fiber supplement. °· Taking a live bacteria supplement (probiotic). °· Taking medicine to relax your colon. °· Taking antibiotic medicines. ° °Follow these instructions at home: °· Drink 6-8 glasses of water or more each day to prevent constipation. °· Try not to strain when you have a bowel movement. °· If you have had an infection before: °? Eat more fiber as directed by your health care provider or your diet and nutrition specialist (dietitian). °? Take a fiber supplement or probiotic, if your health care provider approves. °· Take over-the-counter and prescription medicines only as told by your health care provider. °· If you were prescribed an antibiotic, take it as told by your health care provider. Do not stop taking the antibiotic even if you start to feel better. °· Keep all follow-up visits as told by your health care provider. This is important. °Contact a health care provider if: °· You have pain in your abdomen. °· You have bloating. °· You have cramps. °· You have not had a bowel movement in 3 days. °Get help right away if: °· Your pain gets worse. °· Your bloating becomes very  bad. °· You have a fever or chills, and your symptoms suddenly get worse. °· You vomit. °· You have bowel movements that are bloody or black. °· You have bleeding from your rectum. °Summary °· Diverticulosis is a condition that develops when small pouches (diverticula) form in the wall of the large intestine (colon). °· You may have a few pouches or many of them. °· This condition is most often diagnosed during an exam for other colon problems. °· If you have had an infection related to diverticulosis, treatment may include increasing the fiber in your diet, taking supplements, or taking medicines. °This information is not intended to replace advice given to you by your health care   provider. Make sure you discuss any questions you have with your health care provider. °Document Released: 08/23/2004 Document Revised: 10/15/2016 Document Reviewed: 10/15/2016 °Elsevier Interactive Patient Education © 2017 Elsevier Inc. ° °

## 2018-02-12 NOTE — Op Note (Signed)
[facility] Patient Name: Catherine Pugh Procedure Date: 02/12/2018 8:06 AM MRN: [id number] Date of Birth: 30-Jan-1966 Attending MD: Lionel DecemberNajeeb Keryn Nessler , MD CSN: [id number] Age: [id number] Admit Type: Outpatient Procedure:                Colonoscopy Indications:              Screening for colorectal malignant neoplasm Providers:                Lionel DecemberNajeeb Jontavia Leatherbury, MD, Loma MessingLurae B. Patsy LagerAlbert RN, RN, Burke Keelsrisann                            Tilley, Technician Referring MD:             Vilinda BlanksWilliam S. Gerda DissLuking, MD Medicines:                Meperidine 50 mg IV, Midazolam 7 mg IV Complications:            No immediate complications. Estimated Blood Loss:     Estimated blood loss: none. Procedure:                Pre-Anesthesia Assessment:                           - Prior to the procedure, a History and Physical                            was performed, and patient medications and                            allergies were reviewed. The patient's tolerance of                            previous anesthesia was also reviewed. The risks                            and benefits of the procedure and the sedation                            options and risks were discussed with the patient.                            All questions were answered, and informed consent                            was obtained. Prior Anticoagulants: The patient has                            taken no previous anticoagulant or antiplatelet                            agents. ASA Grade Assessment: I - A normal, healthy                            patient. After reviewing the risks and benefits,  the patient was deemed in satisfactory condition to                            undergo the procedure.                           After obtaining informed consent, the colonoscope                            was passed under direct vision. Throughout the                            procedure, the patient's blood pressure, pulse, and                             oxygen saturations were monitored continuously. The                            EC-[id number] ([id number]) scope was introduced through                            the anus and advanced to the the cecum, identified                            by appendiceal orifice and ileocecal valve. The                            colonoscopy was performed without difficulty. The                            patient tolerated the procedure well. The quality                            of the bowel preparation was good. The ileocecal                            valve, appendiceal orifice, and rectum were                            photographed. Scope In: 8:21:28 AM Scope Out: 8:38:31 AM Scope Withdrawal Time: 0 hours 8 minutes 58 seconds  Total Procedure Duration: 0 hours 17 minutes 3 seconds  Findings:      The perianal and digital rectal examinations were normal.      Scattered small and large-mouthed diverticula were found in the sigmoid       colon.      The exam was otherwise normal throughout the examined colon.      Internal hemorrhoids were found during retroflexion. The hemorrhoids       were small. Impression:               - Diverticulosis in the sigmoid colon.                           - Internal hemorrhoids.                           -  No specimens collected. Moderate Sedation:      Moderate (conscious) sedation was administered by the endoscopy nurse       and supervised by the endoscopist. The following parameters were       monitored: oxygen saturation, heart rate, blood pressure, CO2       capnography and response to care. Total physician intraservice time was       22 minutes. Recommendation:           - Patient has a contact number available for                            emergencies. The signs and symptoms of potential                            delayed complications were discussed with the                            patient. Return to normal activities tomorrow.                             Written discharge instructions were provided to the                            patient.                           - High fiber diet today.                           - Continue present medications.                           - Repeat colonoscopy in 10 years for screening                            purposes. Procedure Code(s):        --- Professional ---                           [phone], Colonoscopy, flexible; diagnostic, including                            collection of specimen(s) by brushing or washing,                            when performed (separate procedure)                           99152, Moderate sedation services provided by the                            same physician or other qualified health care                            professional performing the diagnostic or  therapeutic service that the sedation supports,                            requiring the presence of an independent trained                            observer to assist in the monitoring of the                            patient's level of consciousness and physiological                            status; initial 15 minutes of intraservice time,                            patient age 41 years or older Diagnosis Code(s):        --- Professional ---                           Z12.11, Encounter for screening for malignant                            neoplasm of colon                           K64.8, Other hemorrhoids                           K57.30, Diverticulosis of large intestine without                            perforation or abscess without bleeding CPT copyright 2016 American Medical Association. All rights reserved. The codes documented in this report are preliminary and upon coder review may  be revised to meet current compliance requirements. Lionel December, MD Lionel December, MD 02/12/2018 8:45:59 AM This report has been signed electronically. Number of Addenda: 0

## 2018-02-17 ENCOUNTER — Encounter (HOSPITAL_COMMUNITY): Payer: Self-pay | Admitting: Internal Medicine

## 2018-03-13 ENCOUNTER — Telehealth: Payer: Self-pay | Admitting: Family Medicine

## 2018-03-13 ENCOUNTER — Other Ambulatory Visit: Payer: Self-pay | Admitting: Family Medicine

## 2018-03-13 DIAGNOSIS — E039 Hypothyroidism, unspecified: Secondary | ICD-10-CM

## 2018-03-13 MED ORDER — OLOPATADINE HCL 0.1 % OP SOLN: 1.0000 [drp] | Freq: Two times a day (BID) | OPHTHALMIC | 6 refills | Status: DC

## 2018-03-13 NOTE — Telephone Encounter (Signed)
Med put in to [facility]; pt aware

## 2018-03-13 NOTE — Telephone Encounter (Signed)
Patient said that Dr. Brett CanalesSteve wanted her to have her blood work rechecked for her thyroid to make sure her medication is working properly.  She is requesting this to be ordered and she hopes to do this tomorrow or Saturday.  Also, would like Rx for Patanol allergy eyes drops to [facility].

## 2018-03-13 NOTE — Telephone Encounter (Signed)
May have a prescription for Patanol eyedrops 6 refills I believe the proper directions use 1 drop each eye twice daily.

## 2018-03-13 NOTE — Progress Notes (Unsigned)
pata

## 2018-03-13 NOTE — Telephone Encounter (Signed)
Labs placed. Give Rx for patanol allergy eye drops?

## 2018-03-13 NOTE — Telephone Encounter (Signed)
TSH and free T4

## 2018-03-14 DIAGNOSIS — E039 Hypothyroidism, unspecified: Secondary | ICD-10-CM | POA: Diagnosis not present

## 2018-03-15 LAB — TSH+FREE T4
FREE T4: 1.52 ng/dL (ref 0.82–1.77)
TSH: 0.19 u[IU]/mL — ABNORMAL LOW (ref 0.450–4.500)

## 2018-03-21 ENCOUNTER — Telehealth: Payer: Self-pay | Admitting: Family Medicine

## 2018-03-21 MED ORDER — LEVOTHYROXINE SODIUM 75 MCG PO TABS: 75.0000 ug | ORAL_TABLET | Freq: Every day | ORAL | 11 refills | Status: DC

## 2018-03-21 NOTE — Telephone Encounter (Signed)
Results discussed with patient. Patient advised per Dr Brett CanalesSteve her tsh was too low now, decrease thyroid medication  to one half a 75 mcg tab on Monday and Thursday, one all other days, Dr Brett CanalesSteve thinks patient can wait til yearly check up in office for next level. Patient verbalized understanding.

## 2018-03-21 NOTE — Telephone Encounter (Signed)
Pt called wanting to know the results to her recent labs. Pt states that if the Dr. Is going to change her medication that she will need that called in today.   [facility]

## 2018-03-21 NOTE — Telephone Encounter (Signed)
Pt  tsh too low now, decrease thyr to one half a 75 mcg tab on mon and thur, one all other days, I think can wait til yrly ck up in office for next level

## 2018-09-01 DIAGNOSIS — Z23 Encounter for immunization: Secondary | ICD-10-CM | POA: Diagnosis not present

## 2018-10-07 ENCOUNTER — Other Ambulatory Visit: Payer: Self-pay | Admitting: Family Medicine

## 2018-10-07 DIAGNOSIS — Z1231 Encounter for screening mammogram for malignant neoplasm of breast: Secondary | ICD-10-CM

## 2018-10-23 ENCOUNTER — Encounter: Payer: Self-pay | Admitting: Family Medicine

## 2018-10-23 ENCOUNTER — Other Ambulatory Visit: Payer: Self-pay | Admitting: Family Medicine

## 2018-10-23 ENCOUNTER — Ambulatory Visit: Payer: 59 | Admitting: Family Medicine

## 2018-10-23 VITALS — BP 130/86 | Temp 98.1°F | Ht 65.0 in | Wt 154.6 lb

## 2018-10-23 DIAGNOSIS — R21 Rash and other nonspecific skin eruption: Secondary | ICD-10-CM | POA: Diagnosis not present

## 2018-10-23 MED ORDER — TRIAMCINOLONE ACETONIDE 0.1 % EX CREA: 1.0000 "application " | TOPICAL_CREAM | Freq: Two times a day (BID) | CUTANEOUS | 1 refills | Status: DC

## 2018-10-23 NOTE — Progress Notes (Signed)
   Subjective:    Patient ID: Catherine Pugh, female    DOB: [birth year], 52 y.o.   MRN: [id number]  Rash  This is a new problem. The current episode started in the past 7 days. The affected locations include the neck, right ear and left ear. The rash is characterized by itchiness and swelling. Associated with: new Rubie MaidMary Kay face wash. Past treatments include anti-itch cream. The treatment provided mild relief.   Pt states that the rash gets worse through out the day   Rash cropped up in the las few days   oitchesd very much  Ear lobes were swollen  Then moved to the neck   Not sure what she got in contact   No new meds   No new  Topical   No  Exposure to  Outdoor stuf  Mostly indoor   Dog      Npo pain but itchoing  Pt has hx of rosaciea    Review of Systems  Skin: Positive for rash.       Objective:   Physical Exam Alert vitals stable, NAD. Blood pressure good on repeat. HEENT normal. Lungs clear. Heart regular rate and rhythm. Patchy maculopapular rash face neck ears  Impression probable contact dermatitis.  Etiology unclear.  Steroid cream.  Twice daily symptom care discussed warning signs discussed       Assessment & Plan:

## 2018-11-17 ENCOUNTER — Ambulatory Visit
Admission: RE | Admit: 2018-11-17 | Discharge: 2018-11-17 | Disposition: A | Discharge status: Home / Self Care | Payer: 59 | Source: Ambulatory Visit | Attending: Family Medicine | Admitting: Family Medicine

## 2018-11-17 DIAGNOSIS — Z1231 Encounter for screening mammogram for malignant neoplasm of breast: Secondary | ICD-10-CM | POA: Diagnosis not present

## 2019-02-03 DIAGNOSIS — L720 Epidermal cyst: Secondary | ICD-10-CM | POA: Diagnosis not present

## 2019-02-03 DIAGNOSIS — L718 Other rosacea: Secondary | ICD-10-CM | POA: Diagnosis not present

## 2019-04-18 ENCOUNTER — Other Ambulatory Visit: Payer: Self-pay | Admitting: Family Medicine

## 2019-04-20 NOTE — Telephone Encounter (Signed)
Three mo ok 

## 2019-08-07 ENCOUNTER — Other Ambulatory Visit: Payer: Self-pay | Admitting: Family Medicine

## 2019-08-07 DIAGNOSIS — E039 Hypothyroidism, unspecified: Secondary | ICD-10-CM

## 2019-08-09 NOTE — Telephone Encounter (Signed)
One mo worth tsh plus t4 plus o v before finished

## 2019-08-10 NOTE — Telephone Encounter (Signed)
Left message to return call 

## 2019-08-10 NOTE — Telephone Encounter (Signed)
Discussed with pt. Pt verbalized understanding. Pt states she will call back to schedule virtual visit and she will do bw next week. Orders put in and one month refill sent to pharm.

## 2019-08-22 LAB — T4, FREE: Free T4: 1.56 ng/dL (ref 0.82–1.77)

## 2019-08-22 LAB — TSH: TSH: 1.07 u[IU]/mL (ref 0.450–4.500)

## 2019-08-25 ENCOUNTER — Other Ambulatory Visit: Payer: Self-pay

## 2019-08-25 ENCOUNTER — Ambulatory Visit (INDEPENDENT_AMBULATORY_CARE_PROVIDER_SITE_OTHER): Payer: 59 | Admitting: Family Medicine

## 2019-08-25 ENCOUNTER — Encounter: Payer: Self-pay | Admitting: Family Medicine

## 2019-08-25 DIAGNOSIS — E039 Hypothyroidism, unspecified: Secondary | ICD-10-CM | POA: Diagnosis not present

## 2019-08-25 MED ORDER — LEVOTHYROXINE SODIUM 75 MCG PO TABS: ORAL_TABLET | ORAL | 3 refills | Status: DC

## 2019-08-25 NOTE — Progress Notes (Signed)
   Subjective:  Audio only  Patient ID: Catherine Pugh, female    DOB: 25-May-1966, 53 y.o.   MRN: [id number]  HPI Pt here today for med check. Pt states she is taking Levothyroxine 75 mcg 1/2 tablet on Monday and Thursday and one tablet on the other days. Pt states she is doing well on current dose.  Virtual Visit via Video Note  I connected with [name] on 08/25/19 at  3:00 PM EDT by a video enabled telemedicine application and verified that I am speaking with the correct person using two identifiers.  Location: Patient: home Provider: office   I discussed the limitations of evaluation and management by telemedicine and the availability of in person appointments. The patient expressed understanding and agreed to proceed.  History of Present Illness:    Observations/Objective:   Assessment and Plan:   Follow Up Instructions:    I discussed the assessment and treatment plan with the patient. The patient was provided an opportunity to ask questions and all were answered. The patient agreed with the plan and demonstrated an understanding of the instructions.   The patient was advised to call back or seek an in-person evaluation if the symptoms worsen or if the condition fails to improve as anticipated.  I provided 21 minutes of non-face-to-face time during this encounter.   Vicente Males, LPN  Longstanding history of hypothyroidism.  Compliant with medication.  Generally does not miss a dose.  No symptoms of high or low thyroid.  Patient did not get flu shot at work in a couple weeks  Results for orders placed or performed in visit on 08/07/19  TSH  Result Value Ref Range   TSH 1.070 0.450 - 4.500 uIU/mL  T4, free  Result Value Ref Range   Free T4 1.56 0.82 - 1.77 ng/dL     Review of Systems  No headache, no major weight loss or weight gain, no chest pain no back pain abdominal pain no change in bowel habits complete ROS otherwise negative   Objective:   Physical Exam  Virtual      Assessment & Plan:  Impression hypothyroidism.  Discussed.  Compliance discussed medications refilled.  Diet exercise discussed follow-up in 1 year

## 2019-09-02 ENCOUNTER — Encounter: Payer: Self-pay | Admitting: Gynecology

## 2019-12-05 IMAGING — MG DIGITAL SCREENING BILATERAL MAMMOGRAM WITH TOMO AND CAD
8 series · 8 of 24 positions shown · non-contrast
Comparison: Previous exam(s).

CLINICAL DATA: Screening.

EXAM:
DIGITAL SCREENING BILATERAL MAMMOGRAM WITH TOMO AND CAD

[R MLO synth-2D]
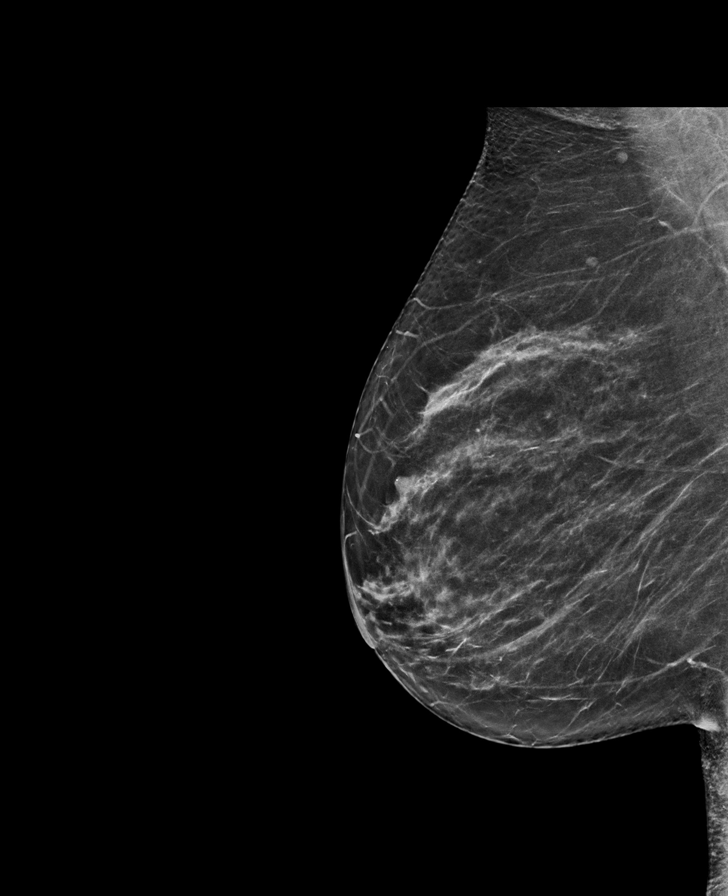

[R CC synth-2D]
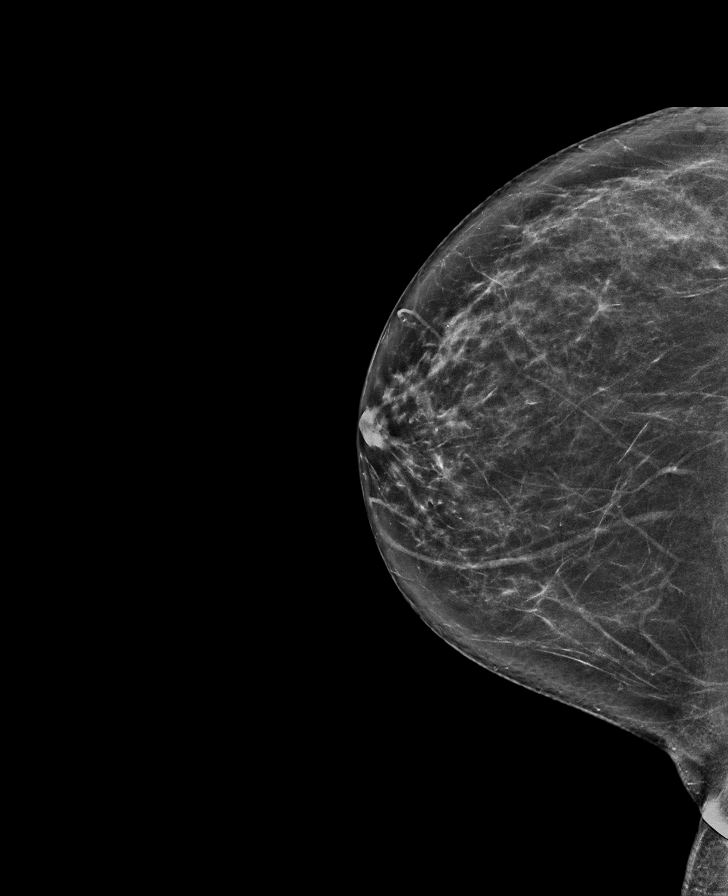

[L CC synth-2D]
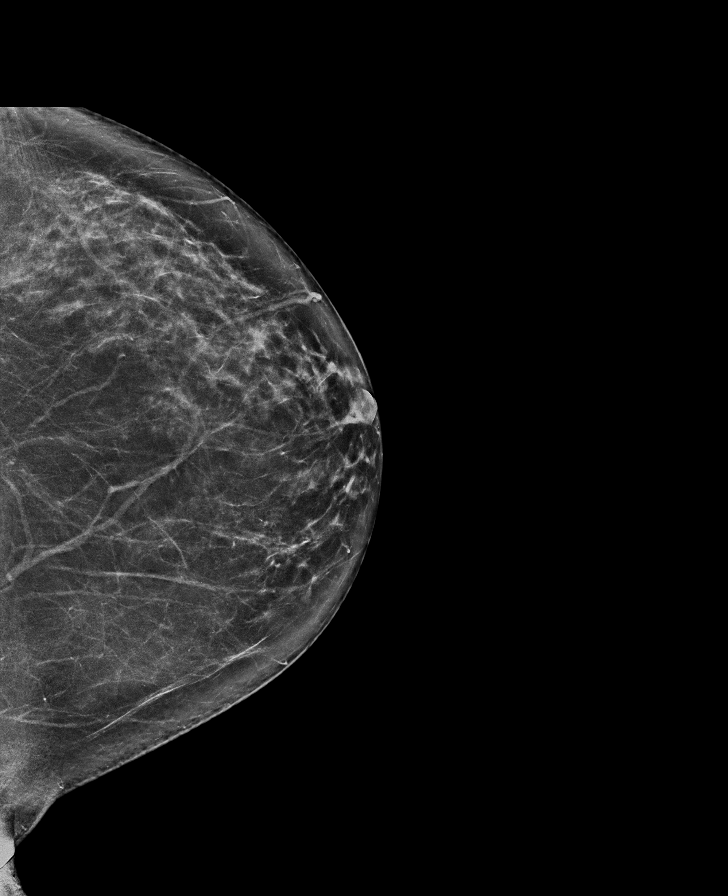

[L MLO synth-2D]
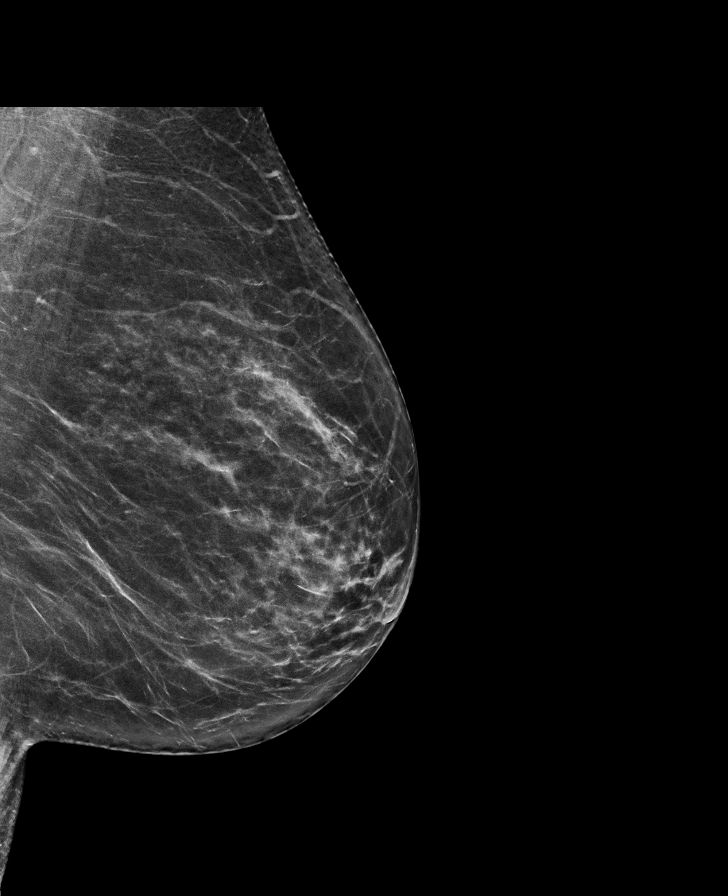

[R CC tomo · tomo slice 38/75.0]
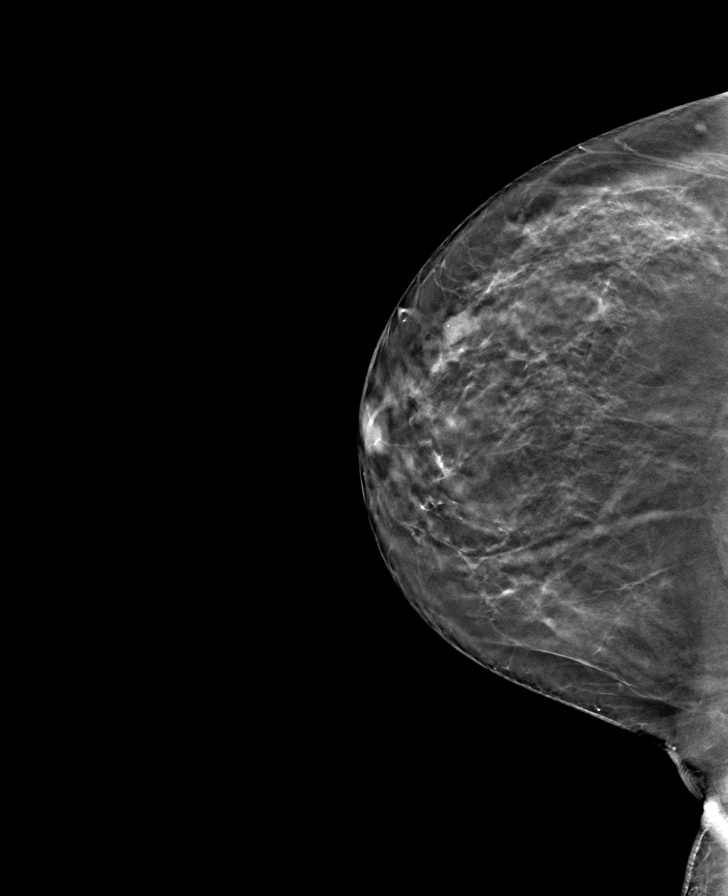

[R MLO tomo · tomo slice 38/75.0]
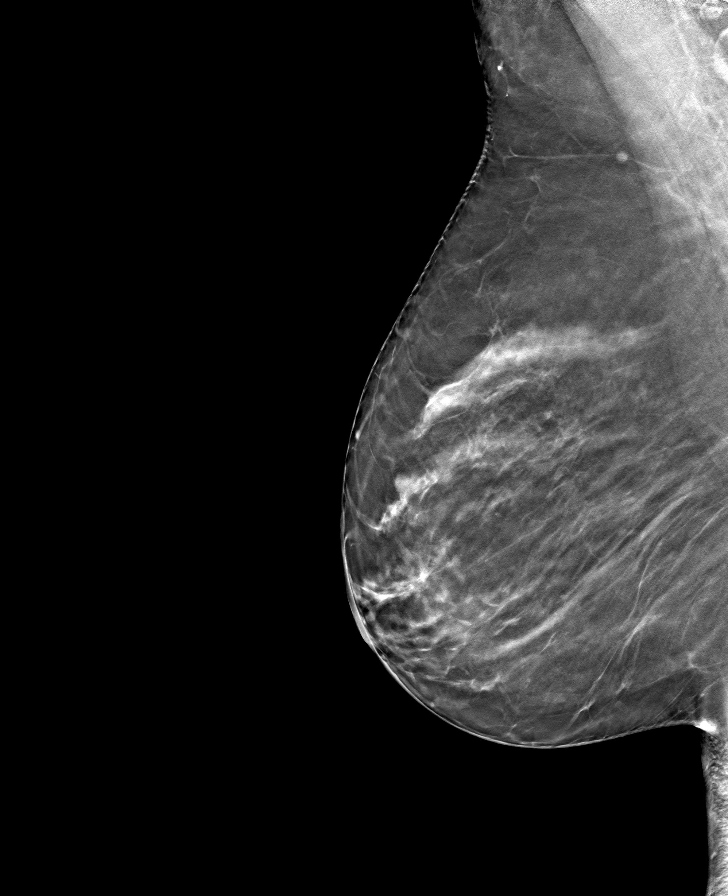

[L MLO tomo · tomo slice 39/76.0]
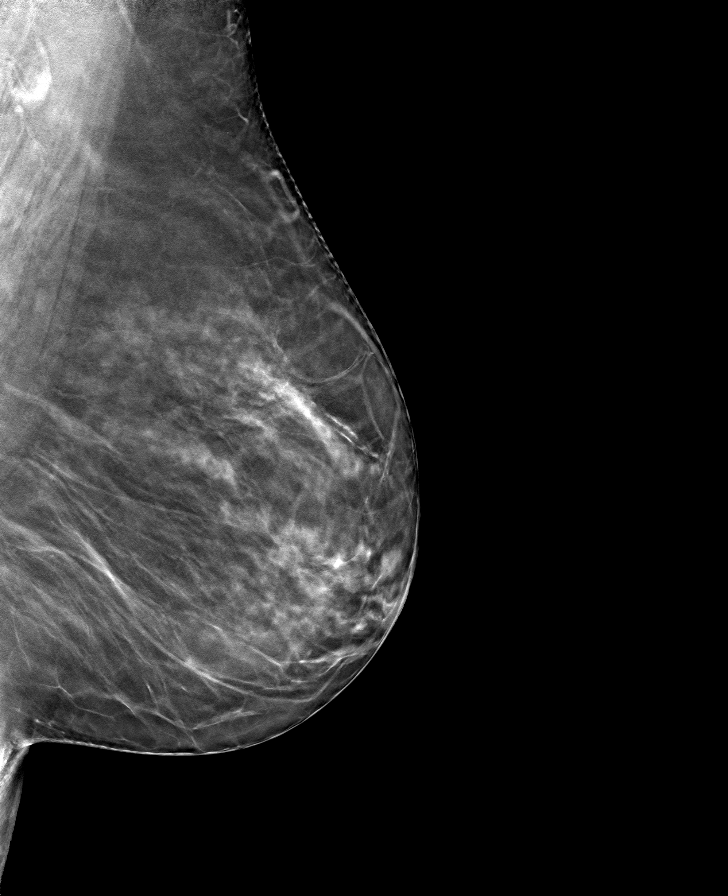

[L CC tomo · tomo slice 39/77.0]
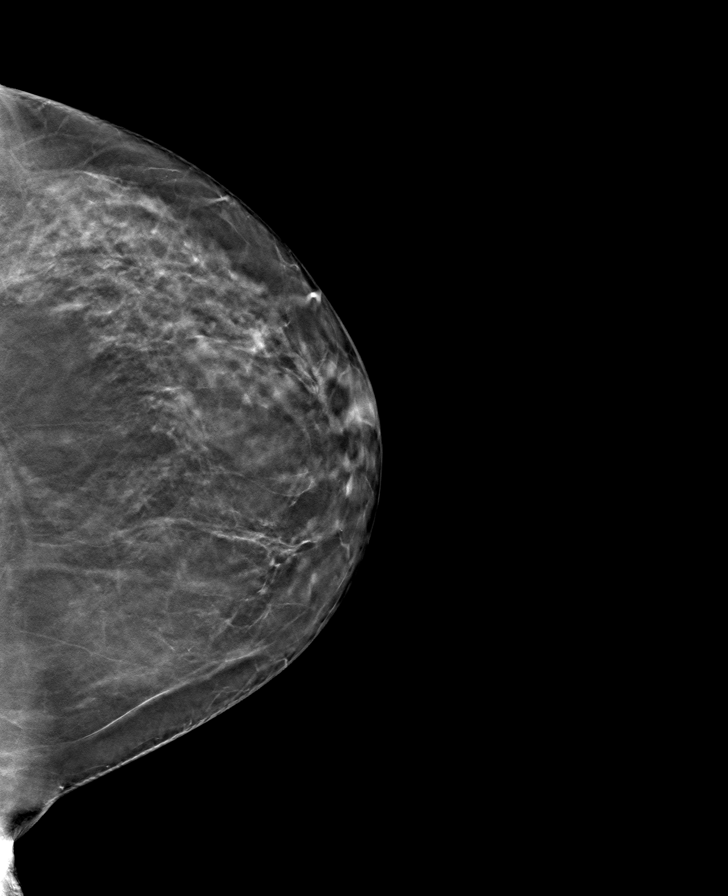

[8 of 24 positions shown; findings below may reference images not displayed]

ACR Breast Density Category c: The breast tissue is heterogeneously
dense, which may obscure small masses.
FINDINGS: There are no findings suspicious for malignancy. Images were
processed with CAD.
IMPRESSION: No mammographic evidence of malignancy. A result letter of this
screening mammogram will be mailed directly to the patient.

RECOMMENDATION:
Screening mammogram in one year. (Code:[id number]-[id number])

BI-RADS CATEGORY  1: Negative.

## 2019-12-07 ENCOUNTER — Other Ambulatory Visit: Payer: Self-pay

## 2019-12-07 ENCOUNTER — Ambulatory Visit: Payer: 59 | Attending: Internal Medicine

## 2019-12-07 DIAGNOSIS — Z20822 Contact with and (suspected) exposure to covid-19: Secondary | ICD-10-CM

## 2019-12-09 LAB — NOVEL CORONAVIRUS, NAA: SARS-CoV-2, NAA: NOT DETECTED

## 2019-12-16 ENCOUNTER — Other Ambulatory Visit: Payer: Self-pay

## 2019-12-16 ENCOUNTER — Ambulatory Visit: Payer: 59 | Attending: Internal Medicine

## 2019-12-16 DIAGNOSIS — Z20822 Contact with and (suspected) exposure to covid-19: Secondary | ICD-10-CM

## 2019-12-18 LAB — NOVEL CORONAVIRUS, NAA: SARS-CoV-2, NAA: DETECTED — AB

## 2020-01-13 ENCOUNTER — Encounter: Payer: Self-pay | Admitting: Family Medicine

## 2020-04-06 ENCOUNTER — Other Ambulatory Visit: Payer: Self-pay | Admitting: Family Medicine

## 2020-07-25 ENCOUNTER — Other Ambulatory Visit: Payer: Self-pay | Admitting: Family Medicine

## 2020-08-22 ENCOUNTER — Telehealth: Payer: Self-pay | Admitting: Family Medicine

## 2020-08-22 ENCOUNTER — Other Ambulatory Visit: Payer: Self-pay | Admitting: Family Medicine

## 2020-08-22 DIAGNOSIS — Z0000 Encounter for general adult medical examination without abnormal findings: Secondary | ICD-10-CM

## 2020-08-22 DIAGNOSIS — Z13220 Encounter for screening for lipoid disorders: Secondary | ICD-10-CM

## 2020-08-22 DIAGNOSIS — Z79899 Other long term (current) drug therapy: Secondary | ICD-10-CM

## 2020-08-22 DIAGNOSIS — Z1231 Encounter for screening mammogram for malignant neoplasm of breast: Secondary | ICD-10-CM

## 2020-08-22 DIAGNOSIS — R5383 Other fatigue: Secondary | ICD-10-CM

## 2020-08-22 DIAGNOSIS — E039 Hypothyroidism, unspecified: Secondary | ICD-10-CM

## 2020-08-22 NOTE — Telephone Encounter (Signed)
Pt has appt for Med Check 10/13 needs blood work done.  Pt call back [phone]

## 2020-08-22 NOTE — Telephone Encounter (Signed)
Labs ordered, patient notified

## 2020-08-27 ENCOUNTER — Other Ambulatory Visit: Payer: Self-pay | Admitting: Family Medicine

## 2020-09-14 LAB — BASIC METABOLIC PANEL
BUN/Creatinine Ratio: 13 (ref 9–23)
BUN: 12 mg/dL (ref 6–24)
CO2: 24 mmol/L (ref 20–29)
Calcium: 9.8 mg/dL (ref 8.7–10.2)
Chloride: 106 mmol/L (ref 96–106)
Creatinine, Ser: 0.93 mg/dL (ref 0.57–1.00)
GFR calc Af Amer: 79 mL/min/{1.73_m2} (ref >59)
GFR calc non Af Amer: 69 mL/min/{1.73_m2} (ref >59)
Glucose: 91 mg/dL (ref 65–99)
Potassium: 4.5 mmol/L (ref 3.5–5.2)
Sodium: 143 mmol/L (ref 134–144)

## 2020-09-14 LAB — CBC WITH DIFFERENTIAL/PLATELET
Basophils Absolute: 0.1 10*3/uL (ref 0.0–0.2)
Basos: 1 %
EOS (ABSOLUTE): 0.3 10*3/uL (ref 0.0–0.4)
Eos: 6 %
Hematocrit: 43.1 % (ref 34.0–46.6)
Hemoglobin: 14.3 g/dL (ref 11.1–15.9)
Immature Grans (Abs): 0 10*3/uL (ref 0.0–0.1)
Immature Granulocytes: 0 %
Lymphocytes Absolute: 1.8 10*3/uL (ref 0.7–3.1)
Lymphs: 32 %
MCH: 31.6 pg (ref 26.6–33.0)
MCHC: 33.2 g/dL (ref 31.5–35.7)
MCV: 95 fL (ref 79–97)
Monocytes Absolute: 0.5 10*3/uL (ref 0.1–0.9)
Monocytes: 9 %
Neutrophils Absolute: 2.9 10*3/uL (ref 1.4–7.0)
Neutrophils: 52 %
Platelets: 277 10*3/uL (ref 150–450)
RBC: 4.52 x10E6/uL (ref 3.77–5.28)
RDW: 12.7 % (ref 11.7–15.4)
WBC: 5.6 10*3/uL (ref 3.4–10.8)

## 2020-09-14 LAB — HEPATIC FUNCTION PANEL
ALT: 15 IU/L (ref 0–32)
AST: 16 IU/L (ref 0–40)
Albumin: 4.5 g/dL (ref 3.8–4.9)
Alkaline Phosphatase: 70 IU/L (ref 44–121)
Bilirubin Total: 0.4 mg/dL (ref 0.0–1.2)
Bilirubin, Direct: <0.1 mg/dL (ref 0.00–0.40)
Total Protein: 6.5 g/dL (ref 6.0–8.5)

## 2020-09-14 LAB — LIPID PANEL
Chol/HDL Ratio: 3 ratio (ref 0.0–4.4)
Cholesterol, Total: 227 mg/dL — ABNORMAL HIGH (ref 100–199)
HDL: 75 mg/dL (ref >39)
LDL Chol Calc (NIH): 134 mg/dL — ABNORMAL HIGH (ref 0–99)
Triglycerides: 104 mg/dL (ref 0–149)
VLDL Cholesterol Cal: 18 mg/dL (ref 5–40)

## 2020-09-14 LAB — TSH: TSH: 0.922 u[IU]/mL (ref 0.450–4.500)

## 2020-09-14 LAB — T4, FREE: Free T4: 1.47 ng/dL (ref 0.82–1.77)

## 2020-09-21 ENCOUNTER — Encounter: Payer: Self-pay | Admitting: Family Medicine

## 2020-09-21 ENCOUNTER — Ambulatory Visit (INDEPENDENT_AMBULATORY_CARE_PROVIDER_SITE_OTHER): Payer: 59 | Admitting: Family Medicine

## 2020-09-21 VITALS — BP 122/78 | HR 75 | Temp 97.7°F | Ht 65.0 in | Wt 148.4 lb

## 2020-09-21 DIAGNOSIS — H0015 Chalazion left lower eyelid: Secondary | ICD-10-CM

## 2020-09-21 DIAGNOSIS — E782 Mixed hyperlipidemia: Secondary | ICD-10-CM

## 2020-09-21 DIAGNOSIS — E039 Hypothyroidism, unspecified: Secondary | ICD-10-CM | POA: Diagnosis not present

## 2020-09-21 DIAGNOSIS — Z23 Encounter for immunization: Secondary | ICD-10-CM

## 2020-09-21 MED ORDER — LEVOTHYROXINE SODIUM 75 MCG PO TABS
ORAL_TABLET | ORAL | 3 refills | Status: DC
Start: 2020-09-21 — End: 2021-09-25

## 2020-09-21 MED ORDER — ERYTHROMYCIN 5 MG/GM OP OINT: 1.0000 "application " | TOPICAL_OINTMENT | Freq: Three times a day (TID) | OPHTHALMIC | 0 refills | Status: DC

## 2020-09-21 NOTE — Progress Notes (Signed)
Patient ID: Catherine Pugh, female    DOB: 01/04/1966, 54 y.o.   MRN: [id number]   Chief Complaint  Patient presents with  . Hypothyroidism   Subjective:    HPI Coming for yearly thyroid labs.  Seeing gyn for well woman exam.  Thyroid- Doing well with medications. No constipation, diarrhea, hair loss, skin changes,   HLD- good HDL. Eating more fish in diet.  Has mammo set up 09/29/20. Seeing gyn soon.  Has left eye erythema, has the frequently and seeing eye doctor in past. Getting stye frequently. Using compresses and baby shampoo.   Seeing Dr. Montez Pugh for eye doctor and has h/o chalazion and had to have them lanced in the past. Doing hot compresses, but still feeling roscaea on face. And left eye can get stye.  Wanting something for it either drop or ointment for it.   Medical History Catherine Pugh has a past medical history of ASCUS (atypical squamous cells of undetermined significance) on Pap smear (2012), Hypothyroidism, and Thyroid disease.   Outpatient Encounter Medications as of 09/21/2020  Medication Sig  . erythromycin ophthalmic ointment Place 1 application into the left eye 3 (three) times daily. Apply for 1 wk.  . levothyroxine (SYNTHROID) 75 MCG tablet TAKE ONE TABLET BY MOUTH ONCE DAILY.  . naproxen sodium (ALEVE) 220 MG tablet Take 440 mg by mouth daily as needed (for pain or headache).  Marland Kitchen olopatadine (PATANOL) 0.1 % ophthalmic solution PUT 1 DROP IN [facility] EYE TWICE DAILY.  Marland Kitchen triamcinolone cream (KENALOG) 0.1 % Apply 1 application topically 2 (two) times daily. (Patient not taking: Reported on 08/25/2019)  . [DISCONTINUED] levothyroxine (SYNTHROID) 75 MCG tablet TAKE ONE TABLET BY MOUTH ONCE DAILY.   No facility-administered encounter medications on file as of 09/21/2020.     Review of Systems  Constitutional: Negative for chills and fever.  HENT: Negative for congestion, rhinorrhea and sore throat.   Eyes: Positive for pain (left lower lid).    Respiratory: Negative for cough, shortness of breath and wheezing.   Cardiovascular: Negative for chest pain and leg swelling.  Gastrointestinal: Negative for abdominal pain, diarrhea, nausea and vomiting.  Genitourinary: Negative for dysuria and frequency.  Musculoskeletal: Negative for arthralgias and back pain.  Skin: Negative for rash.  Neurological: Negative for dizziness, weakness and headaches.     Vitals BP 122/78   Pulse 75   Temp 97.7 F (36.5 C) (Oral)   Ht 5\' 5"  (1.651 m)   Wt 148 lb 6.4 oz (67.3 kg)   LMP 08/10/2012   SpO2 100%   BMI 24.70 kg/m   Objective:   Physical Exam Vitals and nursing note reviewed.  Constitutional:      General: She is not in acute distress.    Appearance: Normal appearance. She is not ill-appearing.  HENT:     Head: Normocephalic and atraumatic.     Nose: Nose normal.     Mouth/Throat:     Mouth: Mucous membranes are moist.     Pharynx: Oropharynx is clear.  Eyes:     Extraocular Movements: Extraocular movements intact.     Conjunctiva/sclera: Conjunctivae normal.     Pupils: Pupils are equal, round, and reactive to light.  Cardiovascular:     Rate and Rhythm: Normal rate and regular rhythm.     Pulses: Normal pulses.     Heart sounds: Normal heart sounds.  Pulmonary:     Effort: Pulmonary effort is normal.     Breath sounds: Normal breath sounds. No  wheezing, rhonchi or rales.  Musculoskeletal:        General: Normal range of motion.     Right lower leg: No edema.     Left lower leg: No edema.  Skin:    General: Skin is warm and dry.     Findings: No lesion or rash.  Neurological:     General: No focal deficit present.     Mental Status: She is alert and oriented to person, place, and time.     Cranial Nerves: No cranial nerve deficit.  Psychiatric:        Mood and Affect: Mood normal.        Behavior: Behavior normal.      Assessment and Plan   1. Hypothyroidism, unspecified type  2. Need for vaccination -  Flu Vaccine QUAD 36+ mos IM  3. Chalazion of left lower eyelid  4. Mixed hyperlipidemia    Had 2nd vaccine of pfizer covid, recently. Wanting flu vaccine today.  Hypothyroid-stable. Cont meds. Labs reviewed,  all stable.  HLD- stable, pt has good HDL, elevated LDL.  Cont to monitor and dec cholesterol in diet.  H/o chalazion- gave erythromycin ointment to see if will help.  Advised needing to do the baby shampoo on her eyelids and warm compresses.  F/u 83mo or prn.

## 2020-09-29 ENCOUNTER — Ambulatory Visit
Admission: RE | Admit: 2020-09-29 | Discharge: 2020-09-29 | Disposition: A | Discharge status: Home / Self Care | Payer: 59 | Source: Ambulatory Visit | Attending: Family Medicine | Admitting: Family Medicine

## 2020-09-29 ENCOUNTER — Other Ambulatory Visit: Payer: Self-pay

## 2020-09-29 DIAGNOSIS — Z1231 Encounter for screening mammogram for malignant neoplasm of breast: Secondary | ICD-10-CM

## 2020-10-11 ENCOUNTER — Other Ambulatory Visit: Payer: Self-pay

## 2020-10-11 ENCOUNTER — Ambulatory Visit: Payer: 59 | Admitting: Obstetrics and Gynecology

## 2020-10-11 ENCOUNTER — Encounter: Payer: Self-pay | Admitting: Obstetrics and Gynecology

## 2020-10-11 VITALS — BP 122/74 | Ht 65.0 in | Wt 148.0 lb

## 2020-10-11 DIAGNOSIS — Z01419 Encounter for gynecological examination (general) (routine) without abnormal findings: Secondary | ICD-10-CM

## 2020-10-11 NOTE — Progress Notes (Signed)
   Catherine Pugh 22-Feb-1966 [id number]  SUBJECTIVE:  54 y.o. G2P2 female for annual routine gynecologic exam and Pap smear.  Last seen in this office 10/2017.  She has no gynecologic concerns.  Current Outpatient Medications  Medication Sig Dispense Refill  . levothyroxine (SYNTHROID) 75 MCG tablet TAKE ONE TABLET BY MOUTH ONCE DAILY. 90 tablet 3  . naproxen sodium (ALEVE) 220 MG tablet Take 440 mg by mouth daily as needed (for pain or headache).    . triamcinolone cream (KENALOG) 0.1 % Apply 1 application topically 2 (two) times daily. 30 g 1  . erythromycin ophthalmic ointment Place 1 application into the left eye 3 (three) times daily. Apply for 1 wk. (Patient not taking: Reported on 10/11/2020) 3.5 g 0  . olopatadine (PATANOL) 0.1 % ophthalmic solution PUT 1 DROP IN [facility] EYE TWICE DAILY. (Patient not taking: Reported on 10/11/2020) 5 mL 0   No current facility-administered medications for this visit.   Allergies: Patient has no known allergies.  Patient's last menstrual period was 08/10/2012.  Past medical history,surgical history, problem list, medications, allergies, family history and social history were all reviewed and documented as reviewed in the EPIC chart.  ROS: Pertinent positives and negatives as reviewed in HPI   OBJECTIVE:  BP 122/74   Ht 5\' 5"  (1.651 m)   Wt 148 lb (67.1 kg)   LMP 08/10/2012   BMI 24.63 kg/m  The patient appears well, alert, oriented, in no distress. ENT normal.  Neck supple. No cervical or supraclavicular adenopathy or thyromegaly.  Lungs are clear, good air entry, no wheezes, rhonchi or rales. S1 and S2 normal, no murmurs, regular rate and rhythm.  Abdomen soft without tenderness, guarding, mass or organomegaly.  Neurological is normal, no focal findings.  BREAST EXAM: breasts appear normal, no suspicious masses, no skin or nipple changes or axillary nodes  PELVIC EXAM: VULVA: normal appearing vulva with atrophic change, no masses,  tenderness or lesions, VAGINA: normal appearing vagina with atrophic change, normal color and discharge, no lesions, CERVIX: normal appearing cervix without discharge or lesions, UTERUS: uterus is normal size, shape, consistency and nontender, ADNEXA: normal adnexa in size, nontender and no masses  Chaperone: 10/10/2012 present during the examination  ASSESSMENT:  54 y.o. G2P2 here for annual gynecologic exam  PLAN:   1. Postmenopausal.  No significant hot flashes or night sweats.  No vaginal bleeding. 2. Pap smear/HPV 10/2017.  History ASCUS with negative high-risk HPV in 2012, normal Pap smears otherwise.  Next Pap smear due 2023 following the current guidelines recommending the 5 year co-testing interval. 3. Mammogram 09/2020.  Normal breast exam today.  Continue with annual mammograms. 4. Colonoscopy 2019.  She will follow up at the recommended interval per her GI specialist. 5. DEXA never.  Would plan further in to menopause. 6. Health maintenance.  No labs today as she normally has these completed elsewhere.  Return annually or sooner, prn.  2020 MD 10/11/20

## 2020-11-17 ENCOUNTER — Other Ambulatory Visit: Payer: Self-pay

## 2020-11-17 ENCOUNTER — Other Ambulatory Visit (INDEPENDENT_AMBULATORY_CARE_PROVIDER_SITE_OTHER): Payer: 59 | Admitting: *Deleted

## 2020-11-17 DIAGNOSIS — Z23 Encounter for immunization: Secondary | ICD-10-CM | POA: Diagnosis not present

## 2021-06-09 ENCOUNTER — Telehealth: Payer: 59 | Admitting: Family Medicine

## 2021-06-09 MED ORDER — AZITHROMYCIN 250 MG PO TABS: ORAL_TABLET | ORAL | 0 refills | Status: AC

## 2021-06-09 NOTE — Telephone Encounter (Signed)
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 Patient states she woke up with one this am and called her eye doctor and he is out of the office for July 4th till Tuesday and wondered if she could possibly get a z pack sent in to [facility] to keep it from getting worse over the holiday weekend- she will follow up with her eye doctor   [address]

## 2021-06-09 NOTE — Telephone Encounter (Signed)
Medication sent to pharmacy and pt is aware 

## 2021-06-09 NOTE — Telephone Encounter (Signed)
Pt is requesting a antibiotic  due to her right eye it is Swollen, sore. Dr. Charise Killian prescribes pt a zpack and it helps but Dr.Cotter is out of the office today.. pt is wondering if Dr.Taylor could send it in . Pt would like a call back if this is possible   [facility] - [place], [place] - [address]

## 2021-09-22 ENCOUNTER — Other Ambulatory Visit: Payer: Self-pay | Admitting: Family Medicine

## 2021-09-25 ENCOUNTER — Telehealth: Payer: Self-pay | Admitting: Family Medicine

## 2021-09-25 DIAGNOSIS — E039 Hypothyroidism, unspecified: Secondary | ICD-10-CM

## 2021-09-25 DIAGNOSIS — E782 Mixed hyperlipidemia: Secondary | ICD-10-CM

## 2021-09-25 DIAGNOSIS — Z79899 Other long term (current) drug therapy: Secondary | ICD-10-CM

## 2021-09-25 NOTE — Telephone Encounter (Signed)
Last labs completed 09/13/20; BMET,CBC,TSH, FREE T4, HEPATIC and LIPID. Please advise. Thank you

## 2021-09-25 NOTE — Telephone Encounter (Signed)
Lab orders placed and pt is aware 

## 2021-09-25 NOTE — Telephone Encounter (Signed)
Labs: CBC, CMP+GFR, TSH, Free T4, Lipid.  Please order.  Thank you  Dr. Adriana Simas

## 2021-09-25 NOTE — Telephone Encounter (Signed)
Patient has appointment 10/24 and stopped in the office today to inquire about labs. She also stated she needs a refill on her thyroid medication.  [facility] [place] [facility]  CB#  [phone]

## 2021-09-25 NOTE — Telephone Encounter (Signed)
Patient is needing refill on levothyroxine 75 mg called into [facility] pharmacy has appointment on 10/24

## 2021-09-25 NOTE — Telephone Encounter (Signed)
Duplicate message. 

## 2021-09-28 ENCOUNTER — Other Ambulatory Visit: Payer: Self-pay | Admitting: Family Medicine

## 2021-09-28 LAB — CBC WITH DIFFERENTIAL/PLATELET
Basophils Absolute: 0.1 10*3/uL (ref 0.0–0.2)
Basos: 1 %
EOS (ABSOLUTE): 0.4 10*3/uL (ref 0.0–0.4)
Eos: 8 %
Hematocrit: 40.9 % (ref 34.0–46.6)
Hemoglobin: 14.2 g/dL (ref 11.1–15.9)
Immature Grans (Abs): 0 10*3/uL (ref 0.0–0.1)
Immature Granulocytes: 0 %
Lymphocytes Absolute: 1.6 10*3/uL (ref 0.7–3.1)
Lymphs: 32 %
MCH: 31.8 pg (ref 26.6–33.0)
MCHC: 34.7 g/dL (ref 31.5–35.7)
MCV: 92 fL (ref 79–97)
Monocytes Absolute: 0.6 10*3/uL (ref 0.1–0.9)
Monocytes: 11 %
Neutrophils Absolute: 2.5 10*3/uL (ref 1.4–7.0)
Neutrophils: 48 %
Platelets: 273 10*3/uL (ref 150–450)
RBC: 4.46 x10E6/uL (ref 3.77–5.28)
RDW: 12.2 % (ref 11.7–15.4)
WBC: 5.1 10*3/uL (ref 3.4–10.8)

## 2021-09-28 LAB — CMP14+EGFR
ALT: 17 IU/L (ref 0–32)
AST: 18 IU/L (ref 0–40)
Albumin/Globulin Ratio: 2.3 — ABNORMAL HIGH (ref 1.2–2.2)
Albumin: 4.3 g/dL (ref 3.8–4.9)
Alkaline Phosphatase: 85 IU/L (ref 44–121)
BUN/Creatinine Ratio: 16 (ref 9–23)
BUN: 12 mg/dL (ref 6–24)
Bilirubin Total: 0.3 mg/dL (ref 0.0–1.2)
CO2: 20 mmol/L (ref 20–29)
Calcium: 9.3 mg/dL (ref 8.7–10.2)
Chloride: 108 mmol/L — ABNORMAL HIGH (ref 96–106)
Creatinine, Ser: 0.74 mg/dL (ref 0.57–1.00)
Globulin, Total: 1.9 g/dL (ref 1.5–4.5)
Glucose: 87 mg/dL (ref 70–99)
Potassium: 4.4 mmol/L (ref 3.5–5.2)
Sodium: 143 mmol/L (ref 134–144)
Total Protein: 6.2 g/dL (ref 6.0–8.5)
eGFR: 94 mL/min/{1.73_m2} (ref >59)

## 2021-09-28 LAB — T4, FREE: Free T4: 1.21 ng/dL (ref 0.82–1.77)

## 2021-09-28 LAB — LIPID PANEL
Chol/HDL Ratio: 2.9 ratio (ref 0.0–4.4)
Cholesterol, Total: 198 mg/dL (ref 100–199)
HDL: 68 mg/dL (ref >39)
LDL Chol Calc (NIH): 110 mg/dL — ABNORMAL HIGH (ref 0–99)
Triglycerides: 116 mg/dL (ref 0–149)
VLDL Cholesterol Cal: 20 mg/dL (ref 5–40)

## 2021-09-28 LAB — TSH: TSH: 0.081 u[IU]/mL — ABNORMAL LOW (ref 0.450–4.500)

## 2021-09-28 MED ORDER — LEVOTHYROXINE SODIUM 50 MCG PO TABS
50.0000 ug | ORAL_TABLET | Freq: Every day | ORAL | 1 refills | Status: DC
Start: 2021-09-28 — End: 2021-11-29

## 2021-10-02 ENCOUNTER — Other Ambulatory Visit: Payer: Self-pay

## 2021-10-02 ENCOUNTER — Ambulatory Visit (INDEPENDENT_AMBULATORY_CARE_PROVIDER_SITE_OTHER): Payer: 59 | Admitting: Family Medicine

## 2021-10-02 ENCOUNTER — Encounter: Payer: Self-pay | Admitting: Family Medicine

## 2021-10-02 VITALS — BP 107/74 | HR 67 | Ht 65.0 in | Wt 157.4 lb

## 2021-10-02 DIAGNOSIS — E785 Hyperlipidemia, unspecified: Secondary | ICD-10-CM | POA: Insufficient documentation

## 2021-10-02 DIAGNOSIS — E039 Hypothyroidism, unspecified: Secondary | ICD-10-CM

## 2021-10-02 DIAGNOSIS — Z0000 Encounter for general adult medical examination without abnormal findings: Secondary | ICD-10-CM | POA: Diagnosis not present

## 2021-10-02 NOTE — Progress Notes (Signed)
Subjective:  Patient ID: Catherine Pugh, female    DOB: [birth year]  Age: 55 y.o. MRN: [id number]  CC: Annual exam  HPI Catherine Pugh is a 55 y.o. female presents to the clinic today for an annual exam. She states she is doing well. Thyroid medication recently decreased by me. No complaints at this time.  Preventative Healthcare Pap smear: Up to date. Due in 2023 (sees GYN) Mammogram: Due. Colonoscopy: Up to date.  Immunizations Tetanus - Up to date. Pneumococcal - Not indicated. Flu - Up to date. Zoster - Will consider.  Hepatitis C screening - Declines. Labs: Has had recent labs. Reviewed with patient. Exercise: Working on increasing exercise.  STD/HIV testing: Declines.  PMH, Surgical Hx, Family Hx, Social History reviewed and updated as below.  Past Medical History:  Diagnosis Date   ASCUS (atypical squamous cells of undetermined significance) on Pap smear 2012   Hypothyroidism    Past Surgical History:  Procedure Laterality Date   BREAST BIOPSY Left    COLONOSCOPY N/A 02/12/2018   Procedure: COLONOSCOPY;  Surgeon: Malissa Hippo, MD;  Location: [facility];  Service: Endoscopy;  Laterality: N/A;  830   Family History  Problem Relation Age of Onset   Ovarian cancer Maternal Aunt    Diabetes Cousin    Cancer Mother        sarcoma   Lung cancer Father    Social History   Tobacco Use   Smoking status: Former    Packs/day: 0.30    Years: 20.00    Pack years: 6.00    Types: Cigarettes    Quit date: 05/03/2016    Years since quitting: 5.4   Smokeless tobacco: Never  Substance Use Topics   Alcohol use: Yes    Alcohol/week: 0.0 standard drinks    Comment: Rare    Review of Systems  Constitutional: Negative.   All other systems reviewed and are negative.  Objective:   Today's Vitals: BP 107/74   Pulse 67   Ht 5\' 5"  (1.651 m)   Wt 157 lb 6.4 oz (71.4 kg)   LMP 08/10/2012   BMI 26.19 kg/m   Physical Exam Vitals and nursing note  reviewed.  Constitutional:      Appearance: Normal appearance.  HENT:     Head: Normocephalic and atraumatic.     Right Ear: Tympanic membrane normal.     Left Ear: Tympanic membrane normal.     Mouth/Throat:     Pharynx: Oropharynx is clear. No oropharyngeal exudate or posterior oropharyngeal erythema.  Eyes:     General:        Right eye: No discharge.        Left eye: No discharge.     Conjunctiva/sclera: Conjunctivae normal.  Cardiovascular:     Rate and Rhythm: Normal rate and regular rhythm.  Pulmonary:     Effort: Pulmonary effort is normal.     Breath sounds: Normal breath sounds. No wheezing, rhonchi or rales.  Abdominal:     General: There is no distension.     Palpations: Abdomen is soft.     Tenderness: There is no abdominal tenderness.  Musculoskeletal:     Cervical back: Neck supple.  Skin:    General: Skin is warm.     Findings: No rash.  Neurological:     General: No focal deficit present.     Mental Status: She is alert.  Psychiatric:        Mood and Affect:  Mood normal.        Behavior: Behavior normal.     Assessment & Plan:   Problem List Items Addressed This Visit       Endocrine   Hypothyroidism   Relevant Orders   TSH     Other   Well female exam without gynecological exam - Primary    Doing well.  Preventative health/Health maintenance up dated today.  Patient to get Mammogram soon.  Labs reviewed.  Follow up annually.        Follow-up: Return in about 1 year (around 10/02/2022).  Everlene Other DO [facility]

## 2021-10-02 NOTE — Patient Instructions (Signed)
TSH After 12/10.  Follow up annually or sooner if needed.  Take care  Dr. Adriana Simas

## 2021-10-02 NOTE — Assessment & Plan Note (Signed)
Doing well.  Preventative health/Health maintenance up dated today.  Patient to get Mammogram soon.  Labs reviewed.  Follow up annually.

## 2021-10-12 ENCOUNTER — Ambulatory Visit: Payer: 59 | Admitting: Obstetrics and Gynecology

## 2021-10-13 ENCOUNTER — Encounter: Payer: Self-pay | Admitting: Obstetrics & Gynecology

## 2021-10-13 ENCOUNTER — Ambulatory Visit (INDEPENDENT_AMBULATORY_CARE_PROVIDER_SITE_OTHER): Payer: 59 | Admitting: Obstetrics & Gynecology

## 2021-10-13 ENCOUNTER — Other Ambulatory Visit (HOSPITAL_COMMUNITY)
Admission: RE | Admit: 2021-10-13 | Discharge: 2021-10-13 | Disposition: A | Discharge status: Home / Self Care | Payer: 59 | Source: Ambulatory Visit | Attending: Obstetrics & Gynecology | Admitting: Obstetrics & Gynecology

## 2021-10-13 ENCOUNTER — Other Ambulatory Visit: Payer: Self-pay

## 2021-10-13 VITALS — BP 110/80 | HR 76 | Resp 16 | Ht 63.75 in | Wt 153.0 lb

## 2021-10-13 DIAGNOSIS — Z0000 Encounter for general adult medical examination without abnormal findings: Secondary | ICD-10-CM

## 2021-10-13 DIAGNOSIS — R8290 Unspecified abnormal findings in urine: Secondary | ICD-10-CM

## 2021-10-13 DIAGNOSIS — Z01419 Encounter for gynecological examination (general) (routine) without abnormal findings: Secondary | ICD-10-CM | POA: Diagnosis present

## 2021-10-13 DIAGNOSIS — Z780 Asymptomatic menopausal state: Secondary | ICD-10-CM

## 2021-10-13 NOTE — Progress Notes (Signed)
Catherine Pugh Apr 29, 1966 [id number]   History:    55 y.o. G2P2L2  RP:  Established patient presenting for annual gyn exam   HPI: Postmenopausal, well on no HRT.  No PMB.  No pelvic pain.  Pap smear Neg/HPV HR Neg 10/2017. Breasts normal.  Will schedule screening mammo now.  BMI 26.47.  Colonoscopy 2019.  Health labs with Fam MD.  Past medical history,surgical history, family history and social history were all reviewed and documented in the EPIC chart.  Gynecologic History Patient's last menstrual period was 08/10/2012.  Obstetric History OB History  Gravida Para Term Preterm AB Living  2 2       2   SAB IAB Ectopic Multiple Live Births               # Outcome Date GA Lbr Len/2nd Weight Sex Delivery Anes PTL Lv  2 Para           1 Para              ROS: A ROS was performed and pertinent positives and negatives are included in the history.  GENERAL: No fevers or chills. HEENT: No change in vision, no earache, sore throat or sinus congestion. NECK: No pain or stiffness. CARDIOVASCULAR: No chest pain or pressure. No palpitations. PULMONARY: No shortness of breath, cough or wheeze. GASTROINTESTINAL: No abdominal pain, nausea, vomiting or diarrhea, melena or bright red blood per rectum. GENITOURINARY: No urinary frequency, urgency, hesitancy or dysuria. MUSCULOSKELETAL: No joint or muscle pain, no back pain, no recent trauma. DERMATOLOGIC: No rash, no itching, no lesions. ENDOCRINE: No polyuria, polydipsia, no heat or cold intolerance. No recent change in weight. HEMATOLOGICAL: No anemia or easy bruising or bleeding. NEUROLOGIC: No headache, seizures, numbness, tingling or weakness. PSYCHIATRIC: No depression, no loss of interest in normal activity or change in sleep pattern.     Exam:   BP 110/80   Pulse 76   Resp 16   Ht 5' 3.75" (1.619 m)   Wt 153 lb (69.4 kg)   LMP 08/10/2012   BMI 26.47 kg/m   Body mass index is 26.47 kg/m.  General appearance : Well  developed well nourished female. No acute distress HEENT: Eyes: no retinal hemorrhage or exudates,  Neck supple, trachea midline, no carotid bruits, no thyroidmegaly Lungs: Clear to auscultation, no rhonchi or wheezes, or rib retractions  Heart: Regular rate and rhythm, no murmurs or gallops Breast:Examined in sitting and supine position were symmetrical in appearance, no palpable masses or tenderness,  no skin retraction, no nipple inversion, no nipple discharge, no skin discoloration, no axillary or supraclavicular lymphadenopathy Abdomen: no palpable masses or tenderness, no rebound or guarding Extremities: no edema or skin discoloration or tenderness  Pelvic: Vulva: Normal             Vagina: No gross lesions or discharge  Cervix: No gross lesions or discharge.  Pap reflex done.  Uterus  AV, normal size, shape and consistency, non-tender and mobile  Adnexa  Without masses or tenderness  Anus: Normal  U/A: Slightly cloudy yellow, protein negative, nitrites negative, white blood cells 10-20, red blood cells 3-10, bacteria moderate.  Urine culture pending.   Assessment/Plan:  55 y.o. female for annual exam   1. Encounter for routine gynecological examination with Papanicolaou smear of cervix Normal gynecologic exam.  Pap reflex done.  Breast exam normal.  Patient will schedule a screening mammogram now.  Colonoscopy 2019.  Body mass index 26.47.  Patient  is improving her fitness and nutrition.  Health labs with family physician. - Cytology - PAP( [facility])  2. Postmenopause Well on no HRT.  No PMB.  Vitamin D supplements, calcium intake of 1.5 g/day total and regular weightbearing physical activities to continue.  3. Abnormal urine odor Mild abnormality in urine analysis with odor in urine on and off but no frequency or dysuria.  Decision to wait on the urine culture to decide if treatment is needed.  - Urinalysis,Complete w/RFL Culture   Genia Del MD, 10:44 AM  10/13/2021

## 2021-10-15 LAB — URINALYSIS, COMPLETE W/RFL CULTURE
Bilirubin Urine: NEGATIVE
Casts: NONE SEEN /LPF
Crystals: NONE SEEN /HPF
Glucose, UA: NEGATIVE
Hyaline Cast: NONE SEEN /LPF
Ketones, ur: NEGATIVE
Nitrites, Initial: NEGATIVE
Protein, ur: NEGATIVE
Specific Gravity, Urine: 1.015 (ref 1.001–1.035)
Yeast: NONE SEEN /HPF
pH: 6 (ref 5.0–8.0)

## 2021-10-15 LAB — CULTURE INDICATED

## 2021-10-15 LAB — URINE CULTURE
MICRO NUMBER:: 12595627
SPECIMEN QUALITY:: ADEQUATE

## 2021-10-16 ENCOUNTER — Other Ambulatory Visit: Payer: Self-pay | Admitting: Family Medicine

## 2021-10-16 DIAGNOSIS — Z1231 Encounter for screening mammogram for malignant neoplasm of breast: Secondary | ICD-10-CM

## 2021-10-16 LAB — CYTOLOGY - PAP: Diagnosis: NEGATIVE

## 2021-10-30 ENCOUNTER — Other Ambulatory Visit: Payer: Self-pay | Admitting: Family Medicine

## 2021-11-14 LAB — TSH: TSH: 4.01 u[IU]/mL (ref 0.450–4.500)

## 2021-11-16 ENCOUNTER — Ambulatory Visit: Payer: 59

## 2021-11-24 ENCOUNTER — Other Ambulatory Visit: Payer: Self-pay | Admitting: Family Medicine

## 2021-11-24 ENCOUNTER — Telehealth: Payer: Self-pay | Admitting: Family Medicine

## 2021-11-24 MED ORDER — NIRMATRELVIR/RITONAVIR (PAXLOVID)TABLET: ORAL_TABLET | ORAL | 0 refills | Status: DC

## 2021-11-24 NOTE — Telephone Encounter (Signed)
Please advise,  Patient 's spouse , taysha majewski tested positive yesterday and medication was called in and she tested positive today with congestion,cough,headache no fever. [employer]

## 2021-11-24 NOTE — Telephone Encounter (Signed)
Patient 's spouse tested positive yesterday and medication was called in and she tested positive today with congestion,cough,headache no fever. [employer]

## 2021-11-28 ENCOUNTER — Other Ambulatory Visit: Payer: Self-pay | Admitting: Family Medicine

## 2021-11-29 NOTE — Telephone Encounter (Signed)
Patient made aware thyroid medication has been sent to pharmacy.

## 2021-11-29 NOTE — Telephone Encounter (Signed)
Pt called to check status of this request. She is completely out of her thyroid medication.

## 2022-02-26 ENCOUNTER — Other Ambulatory Visit: Payer: Self-pay | Admitting: Family Medicine

## 2022-03-29 ENCOUNTER — Other Ambulatory Visit: Payer: Self-pay | Admitting: Family Medicine

## 2022-04-27 ENCOUNTER — Other Ambulatory Visit: Payer: Self-pay | Admitting: Family Medicine

## 2022-04-30 ENCOUNTER — Telehealth: Payer: Self-pay | Admitting: *Deleted

## 2022-05-03 ENCOUNTER — Other Ambulatory Visit: Payer: Self-pay | Admitting: Family Medicine

## 2022-05-03 DIAGNOSIS — E039 Hypothyroidism, unspecified: Secondary | ICD-10-CM

## 2022-05-09 LAB — TSH: TSH: 2.53 u[IU]/mL (ref 0.450–4.500)

## 2022-05-21 ENCOUNTER — Other Ambulatory Visit: Payer: Self-pay

## 2022-05-21 MED ORDER — LEVOTHYROXINE SODIUM 50 MCG PO TABS: ORAL_TABLET | ORAL | 2 refills | Status: DC

## 2022-07-12 NOTE — Telephone Encounter (Signed)
eror

## 2022-07-24 ENCOUNTER — Other Ambulatory Visit: Payer: Self-pay | Admitting: Family Medicine

## 2022-08-10 ENCOUNTER — Ambulatory Visit
Admission: RE | Admit: 2022-08-10 | Discharge: 2022-08-10 | Disposition: A | Discharge status: Home / Self Care | Payer: 59 | Source: Ambulatory Visit | Attending: Family Medicine | Admitting: Family Medicine

## 2022-08-10 DIAGNOSIS — Z1231 Encounter for screening mammogram for malignant neoplasm of breast: Secondary | ICD-10-CM

## 2022-08-20 ENCOUNTER — Ambulatory Visit: Payer: 59 | Admitting: Family Medicine

## 2022-08-20 VITALS — BP 110/74 | HR 86 | Temp 97.5°F | Ht 63.75 in | Wt 152.0 lb

## 2022-08-20 DIAGNOSIS — E663 Overweight: Secondary | ICD-10-CM | POA: Diagnosis not present

## 2022-08-20 DIAGNOSIS — E785 Hyperlipidemia, unspecified: Secondary | ICD-10-CM

## 2022-08-20 DIAGNOSIS — E039 Hypothyroidism, unspecified: Secondary | ICD-10-CM

## 2022-08-20 DIAGNOSIS — Z130 Encounter for screening for diseases of the blood and blood-forming organs and certain disorders involving the immune mechanism: Secondary | ICD-10-CM | POA: Diagnosis not present

## 2022-08-20 DIAGNOSIS — M25511 Pain in right shoulder: Secondary | ICD-10-CM

## 2022-08-20 MED ORDER — MELOXICAM 15 MG PO TABS: 15.0000 mg | ORAL_TABLET | Freq: Every day | ORAL | 0 refills | Status: DC | PRN

## 2022-08-20 NOTE — Patient Instructions (Signed)
Labs today.  Medication as prescribed.  If issues continue to persist, please let me know.  Follow up annually.  Take care  Dr. Adriana Simas

## 2022-08-21 DIAGNOSIS — M25511 Pain in right shoulder: Secondary | ICD-10-CM | POA: Insufficient documentation

## 2022-08-21 LAB — CMP14+EGFR
ALT: 20 IU/L (ref 0–32)
AST: 20 IU/L (ref 0–40)
Albumin/Globulin Ratio: 2.3 — ABNORMAL HIGH (ref 1.2–2.2)
Albumin: 4.5 g/dL (ref 3.8–4.9)
Alkaline Phosphatase: 76 IU/L (ref 44–121)
BUN/Creatinine Ratio: 13 (ref 9–23)
BUN: 11 mg/dL (ref 6–24)
Bilirubin Total: 0.3 mg/dL (ref 0.0–1.2)
CO2: 25 mmol/L (ref 20–29)
Calcium: 9.6 mg/dL (ref 8.7–10.2)
Chloride: 102 mmol/L (ref 96–106)
Creatinine, Ser: 0.85 mg/dL (ref 0.57–1.00)
Globulin, Total: 2 g/dL (ref 1.5–4.5)
Glucose: 83 mg/dL (ref 70–99)
Potassium: 4.2 mmol/L (ref 3.5–5.2)
Sodium: 141 mmol/L (ref 134–144)
Total Protein: 6.5 g/dL (ref 6.0–8.5)
eGFR: 79 mL/min/{1.73_m2} (ref >59)

## 2022-08-21 LAB — CBC
Hematocrit: 40.8 % (ref 34.0–46.6)
Hemoglobin: 14.2 g/dL (ref 11.1–15.9)
MCH: 32.3 pg (ref 26.6–33.0)
MCHC: 34.8 g/dL (ref 31.5–35.7)
MCV: 93 fL (ref 79–97)
Platelets: 272 10*3/uL (ref 150–450)
RBC: 4.4 x10E6/uL (ref 3.77–5.28)
RDW: 13 % (ref 11.7–15.4)
WBC: 7.1 10*3/uL (ref 3.4–10.8)

## 2022-08-21 LAB — LIPID PANEL
Chol/HDL Ratio: 2.9 ratio (ref 0.0–4.4)
Cholesterol, Total: 238 mg/dL — ABNORMAL HIGH (ref 100–199)
HDL: 83 mg/dL (ref >39)
LDL Chol Calc (NIH): 136 mg/dL — ABNORMAL HIGH (ref 0–99)
Triglycerides: 110 mg/dL (ref 0–149)
VLDL Cholesterol Cal: 19 mg/dL (ref 5–40)

## 2022-08-21 LAB — TSH: TSH: 1.52 u[IU]/mL (ref 0.450–4.500)

## 2022-08-21 MED ORDER — LEVOTHYROXINE SODIUM 50 MCG PO TABS: 50.0000 ug | ORAL_TABLET | Freq: Every day | ORAL | 1 refills | Status: DC

## 2022-08-21 NOTE — Assessment & Plan Note (Signed)
Suspected bursitis. Mobic as directed.

## 2022-08-21 NOTE — Assessment & Plan Note (Signed)
Stable.  Continue current dosing of Synthroid. 

## 2022-08-21 NOTE — Progress Notes (Signed)
Subjective:  Patient ID: Catherine Pugh, female    DOB: [birth year]  Age: 56 y.o. MRN: [id number]  CC: Chief Complaint  Patient presents with   Hypothyroidism   right shoulder pain     Hx of bursitis right side, left knee at times    HPI:  56 year old female with hyperlipidemia and hypothyroidism presents for follow-up.  Patient states that overall she is doing well.  She is compliant with Synthroid 50 mcg.  Needs refill.  Patient states that she needs her annual labs.  Patient also reports ongoing right shoulder pain for the past 2 months.  Some associated decreased range of motion (behind the back).  Worse when she lies on it at night.  No significant relieving factors.  Patient Active Problem List   Diagnosis Date Noted   Right shoulder pain 08/21/2022   Hyperlipidemia 10/02/2021   Hypothyroidism 09/03/2016    Social Hx   Social History   Socioeconomic History   Marital status: Married    Spouse name: Not on file   Number of children: Not on file   Years of education: Not on file   Highest education level: Not on file  Occupational History   Not on file  Tobacco Use   Smoking status: Former    Packs/day: 0.30    Years: 20.00    Total pack years: 6.00    Types: Cigarettes    Quit date: 05/03/2016    Years since quitting: 6.3   Smokeless tobacco: Never  Vaping Use   Vaping Use: Never used  Substance and Sexual Activity   Alcohol use: Not Currently   Drug use: No   Sexual activity: Yes    Partners: Male    Birth control/protection: Post-menopausal    Comment: 1st intercourse 36 yo-1 partner  Other Topics Concern   Not on file  Social History Narrative   Not on file   Social Determinants of Health   Financial Resource Strain: Not on file  Food Insecurity: Not on file  Transportation Needs: Not on file  Physical Activity: Not on file  Stress: Not on file  Social Connections: Not on file    Review of Systems Per HPI  Objective:  BP  110/74   Pulse 86   Temp (!) 97.5 F (36.4 C)   Ht 5' 3.75" (1.619 m)   Wt 152 lb (68.9 kg)   LMP 08/10/2012   SpO2 97%   BMI 26.30 kg/m      08/20/2022    3:06 PM 10/13/2021   10:28 AM 10/02/2021    9:12 AM  BP/Weight  Systolic BP 643 329 518  Diastolic BP 74 80 74  Wt. (Lbs) 152 153 157.4  BMI 26.3 kg/m2 26.47 kg/m2 26.19 kg/m2    Physical Exam Constitutional:      General: She is not in acute distress.    Appearance: Normal appearance.  HENT:     Head: Normocephalic and atraumatic.  Eyes:     General:        Right eye: No discharge.        Left eye: No discharge.     Conjunctiva/sclera: Conjunctivae normal.  Cardiovascular:     Rate and Rhythm: Normal rate and regular rhythm.  Pulmonary:     Effort: Pulmonary effort is normal.     Breath sounds: Normal breath sounds.  Musculoskeletal:     Comments: Shoulder: Inspection reveals no abnormalities, atrophy or asymmetry. Palpation is normal with no tenderness over [facility]  joint or bicipital groove. ROM is full in all planes. Rotator cuff strength normal throughout. No signs of impingement with negative Neer and Hawkin's tests, empty can.    Neurological:     Mental Status: She is alert.  Psychiatric:        Mood and Affect: Mood normal.        Behavior: Behavior normal.     Lab Results  Component Value Date   WBC 7.1 08/20/2022   HGB 14.2 08/20/2022   HCT 40.8 08/20/2022   PLT 272 08/20/2022   GLUCOSE 83 08/20/2022   CHOL 238 (H) 08/20/2022   TRIG 110 08/20/2022   HDL 83 08/20/2022   LDLCALC 136 (H) 08/20/2022   ALT 20 08/20/2022   AST 20 08/20/2022   NA 141 08/20/2022   K 4.2 08/20/2022   CL 102 08/20/2022   CREATININE 0.85 08/20/2022   BUN 11 08/20/2022   CO2 25 08/20/2022   TSH 1.520 08/20/2022     Assessment & Plan:   Problem List Items Addressed This Visit       Endocrine   Hypothyroidism - Primary    Stable. Continue current dosing of Synthroid.       Relevant Medications    levothyroxine (SYNTHROID) 50 MCG tablet   Other Relevant Orders   TSH (Completed)     Other   Hyperlipidemia   Relevant Orders   Lipid panel (Completed)   Right shoulder pain    Suspected bursitis. Mobic as directed.       Other Visit Diagnoses     Screening for deficiency anemia       Relevant Orders   CBC (Completed)   Overweight (BMI 25.0-29.9)       Relevant Orders   CMP14+EGFR (Completed)       Meds ordered this encounter  Medications   meloxicam (MOBIC) 15 MG tablet    Sig: Take 1 tablet (15 mg total) by mouth daily as needed for pain.    Dispense:  30 tablet    Refill:  0   levothyroxine (SYNTHROID) 50 MCG tablet    Sig: Take 1 tablet (50 mcg total) by mouth daily.    Dispense:  90 tablet    Refill:  1    Follow-up:  Annually  [place]

## 2022-08-28 ENCOUNTER — Telehealth: Payer: Self-pay | Admitting: *Deleted

## 2022-08-28 NOTE — Telephone Encounter (Signed)
Patient called and stated she tested positive for Covid Sunday 08/26/22 and normally keeps her 73 month old granddaughter and wants to know when it is safe for her to keep her again and she wouldn't be at risk of catching it from her

## 2022-08-29 NOTE — Telephone Encounter (Signed)
Patient notified and verbalized understanding. 

## 2022-08-29 NOTE — Telephone Encounter (Signed)
Coral Spikes, DO     CDC recommends 5 days of quarantine and 5 days of masking. I would wait a total of 10 days before being around her granddaughter.

## 2022-10-17 ENCOUNTER — Encounter: Payer: Self-pay | Admitting: Obstetrics & Gynecology

## 2022-10-17 ENCOUNTER — Ambulatory Visit (INDEPENDENT_AMBULATORY_CARE_PROVIDER_SITE_OTHER): Payer: 59 | Admitting: Obstetrics & Gynecology

## 2022-10-17 VITALS — BP 104/68 | HR 77 | Ht 63.5 in | Wt 150.0 lb

## 2022-10-17 DIAGNOSIS — Z01419 Encounter for gynecological examination (general) (routine) without abnormal findings: Secondary | ICD-10-CM

## 2022-10-17 DIAGNOSIS — Z23 Encounter for immunization: Secondary | ICD-10-CM | POA: Diagnosis not present

## 2022-10-17 DIAGNOSIS — Z780 Asymptomatic menopausal state: Secondary | ICD-10-CM | POA: Diagnosis not present

## 2022-10-17 NOTE — Progress Notes (Signed)
Catherine Pugh 1966/04/27 [id number]   History:    56 y.o.  G2P2L2 Married.  Retired.  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   RP:  Established patient presenting for annual gyn exam    HPI: Postmenopausal, well on no HRT.  No PMB.  No pelvic pain.  Pap smear Neg Neg 10/2021. Will repeat at 2-3 years.  Breasts normal. Screening mammo Neg 08/2022.  BMI 26.15. Physically active.  Colonoscopy 02/2018.  Health labs with Fam MD. Flu vaccine given here today.   Past medical history,surgical history, family history and social history were all reviewed and documented in the EPIC chart.  Gynecologic History Patient's last menstrual period was 08/10/2012.  Obstetric History OB History  Gravida Para Term Preterm AB Living  2 2 2     2   SAB IAB Ectopic Multiple Live Births               # Outcome Date GA Lbr Len/2nd Weight Sex Delivery Anes PTL Lv  2 Term           1 Term              ROS: A ROS was performed and pertinent positives and negatives are included in the history. GENERAL: No fevers or chills. HEENT: No change in vision, no earache, sore throat or sinus congestion. NECK: No pain or stiffness. CARDIOVASCULAR: No chest pain or pressure. No palpitations. PULMONARY: No shortness of breath, cough or wheeze. GASTROINTESTINAL: No abdominal pain, nausea, vomiting or diarrhea, melena or bright red blood per rectum. GENITOURINARY: No urinary frequency, urgency, hesitancy or dysuria. MUSCULOSKELETAL: No joint or muscle pain, no back pain, no recent trauma. DERMATOLOGIC: No rash, no itching, no lesions. ENDOCRINE: No polyuria, polydipsia, no heat or cold intolerance. No recent change in weight. HEMATOLOGICAL: No anemia or easy bruising or bleeding. NEUROLOGIC: No headache, seizures, numbness, tingling or weakness. PSYCHIATRIC: No depression, no loss of interest in normal activity or change in sleep pattern.     Exam:   BP 104/68   Pulse 77   Ht 5' 3.5" (1.613 m)   Wt  150 lb (68 kg)   LMP 08/10/2012   SpO2 99%   BMI 26.15 kg/m   Body mass index is 26.15 kg/m.  General appearance : Well developed well nourished female. No acute distress HEENT: Eyes: no retinal hemorrhage or exudates,  Neck supple, trachea midline, no carotid bruits, no thyroidmegaly Lungs: Clear to auscultation, no rhonchi or wheezes, or rib retractions  Heart: Regular rate and rhythm, no murmurs or gallops Breast:Examined in sitting and supine position were symmetrical in appearance, no palpable masses or tenderness,  no skin retraction, no nipple inversion, no nipple discharge, no skin discoloration, no axillary or supraclavicular lymphadenopathy Abdomen: no palpable masses or tenderness, no rebound or guarding Extremities: no edema or skin discoloration or tenderness  Pelvic: Vulva: Normal             Vagina: No gross lesions or discharge  Cervix: No gross lesions or discharge  Uterus  AV, normal size, shape and consistency, non-tender and mobile  Adnexa  Without masses or tenderness  Anus: Normal   Assessment/Plan:  56 y.o. female for annual exam   1. Well female exam with routine gynecological exam Postmenopausal, well on no HRT.  No PMB.  No pelvic pain.  Pap smear Neg Neg 10/2021. Will repeat at 2-3 years.  Breasts normal. Screening mammo Neg 08/2022.  BMI 26.15. Physically active.  Colonoscopy 02/2018.  Health labs with Fam MD. Flu vaccine given here today.   2. Postmenopause Postmenopausal, well on no HRT.  No PMB.  No pelvic pain.   3. Need for immunization against influenza - Flu Vaccine QUAD 71mo+IM (Fluarix, Fluzone & Alfiuria Quad PF)  Other orders - VITAMIN D PO; Take by mouth. - Ascorbic Acid (VITAMIN C PO); Take by mouth. - Multiple Vitamins-Minerals (ZINC PO); Take by mouth.   Genia Del MD, 4:07 PM 10/17/2022

## 2022-11-16 ENCOUNTER — Other Ambulatory Visit: Payer: Self-pay | Admitting: Family Medicine

## 2022-11-16 ENCOUNTER — Telehealth: Payer: Self-pay | Admitting: *Deleted

## 2022-11-16 MED ORDER — AMOXICILLIN-POT CLAVULANATE 875-125 MG PO TABS: 1.0000 | ORAL_TABLET | Freq: Two times a day (BID) | ORAL | 0 refills | Status: DC

## 2022-11-16 NOTE — Telephone Encounter (Signed)
Patient's husband was seen earlier in the week with respiratory illness and she states she has had similar symptoms and it has now settled in her sinuses and requesting antibiotic to [facility]

## 2022-11-16 NOTE — Telephone Encounter (Signed)
Tommie Sams, DO   Rx sent. Mask around newborn

## 2022-11-16 NOTE — Telephone Encounter (Signed)
Patient notified

## 2022-12-26 ENCOUNTER — Telehealth: Payer: Self-pay | Admitting: *Deleted

## 2022-12-26 DIAGNOSIS — E039 Hypothyroidism, unspecified: Secondary | ICD-10-CM

## 2022-12-26 NOTE — Telephone Encounter (Signed)
Patient wanted to know when she needs to recheck her TSH- last one 08/2022

## 2022-12-27 NOTE — Telephone Encounter (Addendum)
Thersa Salt G, DO     Every 6 months.    Patient notified and blood work ordered in [employer]

## 2023-01-23 ENCOUNTER — Other Ambulatory Visit: Payer: Self-pay | Admitting: Family Medicine

## 2023-02-06 LAB — TSH: TSH: 4.42 u[IU]/mL (ref 0.450–4.500)

## 2023-07-29 ENCOUNTER — Ambulatory Visit (INDEPENDENT_AMBULATORY_CARE_PROVIDER_SITE_OTHER): Payer: 59 | Admitting: Family Medicine

## 2023-07-29 VITALS — BP 124/84 | HR 71 | Wt 160.2 lb

## 2023-07-29 DIAGNOSIS — E785 Hyperlipidemia, unspecified: Secondary | ICD-10-CM | POA: Diagnosis not present

## 2023-07-29 DIAGNOSIS — Z13228 Encounter for screening for other metabolic disorders: Secondary | ICD-10-CM

## 2023-07-29 DIAGNOSIS — J209 Acute bronchitis, unspecified: Secondary | ICD-10-CM | POA: Diagnosis not present

## 2023-07-29 DIAGNOSIS — E039 Hypothyroidism, unspecified: Secondary | ICD-10-CM | POA: Diagnosis not present

## 2023-07-29 DIAGNOSIS — Z130 Encounter for screening for diseases of the blood and blood-forming organs and certain disorders involving the immune mechanism: Secondary | ICD-10-CM

## 2023-07-29 MED ORDER — LEVOTHYROXINE SODIUM 50 MCG PO TABS: 50.0000 ug | ORAL_TABLET | Freq: Every day | ORAL | 1 refills | Status: DC

## 2023-07-29 MED ORDER — DOXYCYCLINE HYCLATE 100 MG PO TABS: 100.0000 mg | ORAL_TABLET | Freq: Two times a day (BID) | ORAL | 0 refills | Status: DC

## 2023-07-29 MED ORDER — PREDNISONE 50 MG PO TABS: 50.0000 mg | ORAL_TABLET | Freq: Every day | ORAL | 0 refills | Status: AC

## 2023-07-29 NOTE — Assessment & Plan Note (Signed)
Treating with doxycycline and prednisone.

## 2023-07-29 NOTE — Progress Notes (Signed)
Subjective:  Patient ID: Catherine Pugh, female    DOB: 1966/03/03  Age: 57 y.o. MRN: [id number]  CC:  Respiratory symptoms   HPI:  57 year old female presents for evaluation of the above.  Patient reports that she has been sick since Tuesday.  Reports chest congestion, headache, productive cough, fatigue.  No fever.  Negative home COVID testing x 2.  No reported sick contacts.  She is most troubled by the cough.  Additionally, patient states that she needs labs to be done and needs a refill on her levothyroxine.  Patient Active Problem List   Diagnosis Date Noted   Acute bronchitis 07/29/2023   Right shoulder pain 08/21/2022   Hyperlipidemia 10/02/2021   Hypothyroidism 09/03/2016    Social Hx   Social History   Socioeconomic History   Marital status: Married    Spouse name: Not on file   Number of children: Not on file   Years of education: Not on file   Highest education level: 12th grade  Occupational History   Not on file  Tobacco Use   Smoking status: Former    Current packs/day: 0.00    Average packs/day: 0.3 packs/day for 20.0 years (6.0 ttl pk-yrs)    Types: Cigarettes    Start date: 05/03/1996    Quit date: 05/03/2016    Years since quitting: 7.2   Smokeless tobacco: Never  Vaping Use   Vaping status: Never Used  Substance and Sexual Activity   Alcohol use: Not Currently   Drug use: No   Sexual activity: Yes    Partners: Male    Birth control/protection: Post-menopausal    Comment: 1st intercourse 85 yo-1 partner  Other Topics Concern   Not on file  Social History Narrative   Not on file   Social Determinants of Health   Financial Resource Strain: Patient Declined (07/29/2023)   Overall Financial Resource Strain (CARDIA)    Difficulty of Paying Living Expenses: Patient declined  Food Insecurity: Patient Declined (07/29/2023)   Hunger Vital Sign    Worried About Running Out of Food in the Last Year: Patient declined    Ran Out of Food in  the Last Year: Patient declined  Transportation Needs: No Transportation Needs (07/29/2023)   PRAPARE - Administrator, Civil Service (Medical): No    Lack of Transportation (Non-Medical): No  Physical Activity: Insufficiently Active (07/29/2023)   Exercise Vital Sign    Days of Exercise per Week: 4 days    Minutes of Exercise per Session: 30 min  Stress: No Stress Concern Present (07/29/2023)   [employer] of Occupational Health - Occupational Stress Questionnaire    Feeling of Stress : Not at all  Social Connections: Moderately Integrated (07/29/2023)   Social Connection and Isolation Panel [NHANES]    Frequency of Communication with Friends and Family: Not on file    Frequency of Social Gatherings with Friends and Family: More than three times a week    Attends Religious Services: More than 4 times per year    Active Member of [employer] or Organizations: No    Attends Engineer, structural: Not on file    Marital Status: Married    Review of Systems Per HPI  Objective:  BP 124/84   Pulse 71   Wt 160 lb 3.2 oz (72.7 kg)   LMP 08/10/2012   SpO2 96%   BMI 27.93 kg/m      07/29/2023    2:47 PM 10/17/2022  3:55 PM 08/20/2022    3:06 PM  BP/Weight  Systolic BP 124 104 110  Diastolic BP 84 68 74  Wt. (Lbs) 160.2 150 152  BMI 27.93 kg/m2 26.15 kg/m2 26.3 kg/m2    Physical Exam Vitals and nursing note reviewed.  Constitutional:      General: She is not in acute distress.    Appearance: Normal appearance.  HENT:     Head: Normocephalic and atraumatic.     Right Ear: Tympanic membrane normal.     Left Ear: Tympanic membrane normal.     Mouth/Throat:     Pharynx: Oropharynx is clear.  Cardiovascular:     Rate and Rhythm: Normal rate and regular rhythm.  Pulmonary:     Comments: Coarse breath sounds. Neurological:     Mental Status: She is alert.  Psychiatric:        Mood and Affect: Mood normal.        Behavior: Behavior normal.     Lab  Results  Component Value Date   WBC 7.1 08/20/2022   HGB 14.2 08/20/2022   HCT 40.8 08/20/2022   PLT 272 08/20/2022   GLUCOSE 83 08/20/2022   CHOL 238 (H) 08/20/2022   TRIG 110 08/20/2022   HDL 83 08/20/2022   LDLCALC 136 (H) 08/20/2022   ALT 20 08/20/2022   AST 20 08/20/2022   NA 141 08/20/2022   K 4.2 08/20/2022   CL 102 08/20/2022   CREATININE 0.85 08/20/2022   BUN 11 08/20/2022   CO2 25 08/20/2022   TSH 4.420 02/05/2023     Assessment & Plan:   Problem List Items Addressed This Visit       Respiratory   Acute bronchitis - Primary    Treating with doxycycline and prednisone.        Endocrine   Hypothyroidism    Labs ordered.  Levothyroxine refilled.      Relevant Medications   levothyroxine (SYNTHROID) 50 MCG tablet   Other Relevant Orders   TSH + free T4     Other   Hyperlipidemia    Lipid panel ordered to reassess.      Relevant Orders   Lipid panel   Other Visit Diagnoses     Screening for deficiency anemia       Relevant Orders   CBC   Screening for metabolic disorder       Relevant Orders   CMP14+EGFR       Meds ordered this encounter  Medications   levothyroxine (SYNTHROID) 50 MCG tablet    Sig: Take 1 tablet (50 mcg total) by mouth daily before breakfast.    Dispense:  90 tablet    Refill:  1   doxycycline (VIBRA-TABS) 100 MG tablet    Sig: Take 1 tablet (100 mg total) by mouth 2 (two) times daily.    Dispense:  14 tablet    Refill:  0   predniSONE (DELTASONE) 50 MG tablet    Sig: Take 1 tablet (50 mg total) by mouth daily for 5 days.    Dispense:  5 tablet    Refill:  0    Sadae Arrazola DO [facility]

## 2023-07-29 NOTE — Assessment & Plan Note (Signed)
Labs ordered.  Levothyroxine refilled.

## 2023-07-29 NOTE — Assessment & Plan Note (Signed)
Lipid panel ordered to reassess. 

## 2023-08-03 LAB — CMP14+EGFR
ALT: 20 IU/L (ref 0–32)
AST: 16 IU/L (ref 0–40)
Albumin: 4.1 g/dL (ref 3.8–4.9)
Alkaline Phosphatase: 88 IU/L (ref 44–121)
BUN/Creatinine Ratio: 17 (ref 9–23)
BUN: 14 mg/dL (ref 6–24)
Bilirubin Total: 0.3 mg/dL (ref 0.0–1.2)
CO2: 20 mmol/L (ref 20–29)
Calcium: 9.4 mg/dL (ref 8.7–10.2)
Chloride: 108 mmol/L — ABNORMAL HIGH (ref 96–106)
Creatinine, Ser: 0.82 mg/dL (ref 0.57–1.00)
Globulin, Total: 2.2 g/dL (ref 1.5–4.5)
Glucose: 104 mg/dL — ABNORMAL HIGH (ref 70–99)
Potassium: 4.6 mmol/L (ref 3.5–5.2)
Sodium: 145 mmol/L — ABNORMAL HIGH (ref 134–144)
Total Protein: 6.3 g/dL (ref 6.0–8.5)
eGFR: 82 mL/min/{1.73_m2} (ref >59)

## 2023-08-03 LAB — CBC
Hematocrit: 39.8 % (ref 34.0–46.6)
Hemoglobin: 13.1 g/dL (ref 11.1–15.9)
MCH: 30.1 pg (ref 26.6–33.0)
MCHC: 32.9 g/dL (ref 31.5–35.7)
MCV: 92 fL (ref 79–97)
Platelets: 393 10*3/uL (ref 150–450)
RBC: 4.35 x10E6/uL (ref 3.77–5.28)
RDW: 12.7 % (ref 11.7–15.4)
WBC: 9.6 10*3/uL (ref 3.4–10.8)

## 2023-08-03 LAB — LIPID PANEL
Chol/HDL Ratio: 2.7 ratio (ref 0.0–4.4)
Cholesterol, Total: 231 mg/dL — ABNORMAL HIGH (ref 100–199)
HDL: 85 mg/dL (ref >39)
LDL Chol Calc (NIH): 131 mg/dL — ABNORMAL HIGH (ref 0–99)
Triglycerides: 86 mg/dL (ref 0–149)
VLDL Cholesterol Cal: 15 mg/dL (ref 5–40)

## 2023-08-03 LAB — TSH+FREE T4
Free T4: 1.18 ng/dL (ref 0.82–1.77)
TSH: 0.964 u[IU]/mL (ref 0.450–4.500)

## 2023-10-01 ENCOUNTER — Ambulatory Visit (INDEPENDENT_AMBULATORY_CARE_PROVIDER_SITE_OTHER): Payer: 59 | Admitting: Family Medicine

## 2023-10-01 VITALS — BP 106/76 | HR 74 | Temp 98.6°F | Ht 63.5 in | Wt 154.8 lb

## 2023-10-01 DIAGNOSIS — J019 Acute sinusitis, unspecified: Secondary | ICD-10-CM

## 2023-10-01 MED ORDER — AMOXICILLIN-POT CLAVULANATE 875-125 MG PO TABS: 1.0000 | ORAL_TABLET | Freq: Two times a day (BID) | ORAL | 0 refills | Status: DC

## 2023-10-01 NOTE — Progress Notes (Signed)
Subjective:  Patient ID: Catherine Pugh, female    DOB: 03-20-1966  Age: 56 y.o. MRN: [id number]  CC:  Congestion   HPI:  57 year old female presents for evaluation of the above.   8 day history of congestion. Discolored nasal discharge. No fever. No cough. No relieving factors. No other complaints.  Patient Active Problem List   Diagnosis Date Noted   Sinusitis 10/02/2023   Right shoulder pain 08/21/2022   Hyperlipidemia 10/02/2021   Hypothyroidism 09/03/2016    Social Hx   Social History   Socioeconomic History   Marital status: Married    Spouse name: Not on file   Number of children: Not on file   Years of education: Not on file   Highest education level: 12th grade  Occupational History   Not on file  Tobacco Use   Smoking status: Former    Current packs/day: 0.00    Average packs/day: 0.3 packs/day for 20.0 years (6.0 ttl pk-yrs)    Types: Cigarettes    Start date: 05/03/1996    Quit date: 05/03/2016    Years since quitting: 7.4   Smokeless tobacco: Never  Vaping Use   Vaping status: Never Used  Substance and Sexual Activity   Alcohol use: Not Currently   Drug use: No   Sexual activity: Yes    Partners: Male    Birth control/protection: Post-menopausal    Comment: 1st intercourse 61 yo-1 partner  Other Topics Concern   Not on file  Social History Narrative   Not on file   Social Determinants of Health   Financial Resource Strain: Patient Declined (07/29/2023)   Overall Financial Resource Strain (CARDIA)    Difficulty of Paying Living Expenses: Patient declined  Food Insecurity: Patient Declined (07/29/2023)   Hunger Vital Sign    Worried About Running Out of Food in the Last Year: Patient declined    Ran Out of Food in the Last Year: Patient declined  Transportation Needs: No Transportation Needs (07/29/2023)   PRAPARE - Administrator, Civil Service (Medical): No    Lack of Transportation (Non-Medical): No  Physical Activity:  Insufficiently Active (07/29/2023)   Exercise Vital Sign    Days of Exercise per Week: 4 days    Minutes of Exercise per Session: 30 min  Stress: No Stress Concern Present (07/29/2023)   [employer] of Occupational Health - Occupational Stress Questionnaire    Feeling of Stress : Not at all  Social Connections: Moderately Integrated (07/29/2023)   Social Connection and Isolation Panel [NHANES]    Frequency of Communication with Friends and Family: Not on file    Frequency of Social Gatherings with Friends and Family: More than three times a week    Attends Religious Services: More than 4 times per year    Active Member of [employer] or Organizations: No    Attends Engineer, structural: Not on file    Marital Status: Married    Review of Systems Per HPI  Objective:  BP 106/76   Pulse 74   Temp 98.6 F (37 C)   Ht 5' 3.5" (1.613 m)   Wt 154 lb 12.8 oz (70.2 kg)   LMP 08/10/2012   SpO2 96%   BMI 26.99 kg/m      10/01/2023    3:18 PM 07/29/2023    2:47 PM 10/17/2022    3:55 PM  BP/Weight  Systolic BP 106 124 104  Diastolic BP 76 84 68  Wt. (  Lbs) 154.8 160.2 150  BMI 26.99 kg/m2 27.93 kg/m2 26.15 kg/m2    Physical Exam Vitals and nursing note reviewed.  Constitutional:      General: She is not in acute distress.    Appearance: Normal appearance.  HENT:     Head: Normocephalic and atraumatic.     Right Ear: Tympanic membrane normal.     Left Ear: Tympanic membrane normal.     Nose: Congestion present.     Mouth/Throat:     Pharynx: Oropharynx is clear.  Cardiovascular:     Rate and Rhythm: Normal rate and regular rhythm.  Pulmonary:     Effort: Pulmonary effort is normal.     Breath sounds: Normal breath sounds. No wheezing or rales.  Neurological:     Mental Status: She is alert.     Lab Results  Component Value Date   WBC 9.6 08/02/2023   HGB 13.1 08/02/2023   HCT 39.8 08/02/2023   PLT 393 08/02/2023   GLUCOSE 104 (H) 08/02/2023   CHOL 231 (H)  08/02/2023   TRIG 86 08/02/2023   HDL 85 08/02/2023   LDLCALC 131 (H) 08/02/2023   ALT 20 08/02/2023   AST 16 08/02/2023   NA 145 (H) 08/02/2023   K 4.6 08/02/2023   CL 108 (H) 08/02/2023   CREATININE 0.82 08/02/2023   BUN 14 08/02/2023   CO2 20 08/02/2023   TSH 0.964 08/02/2023     Assessment & Plan:   Problem List Items Addressed This Visit       Respiratory   Sinusitis - Primary    Treating with Augmentin.      Relevant Medications   amoxicillin-clavulanate (AUGMENTIN) 875-125 MG tablet     Meds ordered this encounter  Medications   amoxicillin-clavulanate (AUGMENTIN) 875-125 MG tablet    Sig: Take 1 tablet by mouth 2 (two) times daily.    Dispense:  14 tablet    Refill:  0    Follow-up:  Return if symptoms worsen or fail to improve.  Everlene Other DO [facility]

## 2023-10-01 NOTE — Progress Notes (Incomplete)
Subjective:  Patient ID: Catherine Pugh, female    DOB: 19-Dec-1965  Age: 57 y.o. MRN: [id number]  CC:  Congestion   HPI:  57 year old female presents for evaluation of the above.   8 day history of congestion. Discolored nasal discharge. No fever. No cough. No relieving factors. No other complaints.  Patient Active Problem List   Diagnosis Date Noted  . Acute bronchitis 07/29/2023  . Right shoulder pain 08/21/2022  . Hyperlipidemia 10/02/2021  . Hypothyroidism 09/03/2016    Social Hx   Social History   Socioeconomic History  . Marital status: Married    Spouse name: Not on file  . Number of children: Not on file  . Years of education: Not on file  . Highest education level: 12th grade  Occupational History  . Not on file  Tobacco Use  . Smoking status: Former    Current packs/day: 0.00    Average packs/day: 0.3 packs/day for 20.0 years (6.0 ttl pk-yrs)    Types: Cigarettes    Start date: 05/03/1996    Quit date: 05/03/2016    Years since quitting: 7.4  . Smokeless tobacco: Never  Vaping Use  . Vaping status: Never Used  Substance and Sexual Activity  . Alcohol use: Not Currently  . Drug use: No  . Sexual activity: Yes    Partners: Male    Birth control/protection: Post-menopausal    Comment: 1st intercourse 78 yo-1 partner  Other Topics Concern  . Not on file  Social History Narrative  . Not on file   Social Determinants of Health   Financial Resource Strain: Patient Declined (07/29/2023)   Overall Financial Resource Strain (CARDIA)   . Difficulty of Paying Living Expenses: Patient declined  Food Insecurity: Patient Declined (07/29/2023)   Hunger Vital Sign   . Worried About Programme researcher, broadcasting/film/video in the Last Year: Patient declined   . Ran Out of Food in the Last Year: Patient declined  Transportation Needs: No Transportation Needs (07/29/2023)   PRAPARE - Transportation   . Lack of Transportation (Medical): No   . Lack of Transportation  (Non-Medical): No  Physical Activity: Insufficiently Active (07/29/2023)   Exercise Vital Sign   . Days of Exercise per Week: 4 days   . Minutes of Exercise per Session: 30 min  Stress: No Stress Concern Present (07/29/2023)   [employer] of Occupational Health - Occupational Stress Questionnaire   . Feeling of Stress : Not at all  Social Connections: Moderately Integrated (07/29/2023)   Social Connection and Isolation Panel [NHANES]   . Frequency of Communication with Friends and Family: Not on file   . Frequency of Social Gatherings with Friends and Family: More than three times a week   . Attends Religious Services: More than 4 times per year   . Active Member of Clubs or Organizations: No   . Attends Banker Meetings: Not on file   . Marital Status: Married    Review of Systems Per HPI  Objective:  BP 106/76   Pulse 74   Temp 98.6 F (37 C)   Ht 5' 3.5" (1.613 m)   Wt 154 lb 12.8 oz (70.2 kg)   LMP 08/10/2012   SpO2 96%   BMI 26.99 kg/m      10/01/2023    3:18 PM 07/29/2023    2:47 PM 10/17/2022    3:55 PM  BP/Weight  Systolic BP 106 124 104  Diastolic BP 76 84 68  Wt. (Lbs) 154.8 160.2 150  BMI 26.99 kg/m2 27.93 kg/m2 26.15 kg/m2    Physical Exam Vitals and nursing note reviewed.  Constitutional:      General: She is not in acute distress.    Appearance: Normal appearance.  HENT:     Head: Normocephalic and atraumatic.     Right Ear: Tympanic membrane normal.     Left Ear: Tympanic membrane normal.     Nose: Congestion present.     Mouth/Throat:     Pharynx: Oropharynx is clear.  Cardiovascular:     Rate and Rhythm: Normal rate and regular rhythm.  Pulmonary:     Effort: Pulmonary effort is normal.     Breath sounds: Normal breath sounds. No wheezing or rales.  Neurological:     Mental Status: She is alert.     Lab Results  Component Value Date   WBC 9.6 08/02/2023   HGB 13.1 08/02/2023   HCT 39.8 08/02/2023   PLT 393  08/02/2023   GLUCOSE 104 (H) 08/02/2023   CHOL 231 (H) 08/02/2023   TRIG 86 08/02/2023   HDL 85 08/02/2023   LDLCALC 131 (H) 08/02/2023   ALT 20 08/02/2023   AST 16 08/02/2023   NA 145 (H) 08/02/2023   K 4.6 08/02/2023   CL 108 (H) 08/02/2023   CREATININE 0.82 08/02/2023   BUN 14 08/02/2023   CO2 20 08/02/2023   TSH 0.964 08/02/2023     Assessment & Plan:   Problem List Items Addressed This Visit   None Visit Diagnoses     Rash    -  Primary   Relevant Orders   Herpes simplex virus culture       Meds ordered this encounter  Medications  . amoxicillin-clavulanate (AUGMENTIN) 875-125 MG tablet    Sig: Take 1 tablet by mouth 2 (two) times daily.    Dispense:  14 tablet    Refill:  0    Follow-up:  No follow-ups on file.  Everlene Other DO [facility]

## 2023-10-02 DIAGNOSIS — J329 Chronic sinusitis, unspecified: Secondary | ICD-10-CM | POA: Insufficient documentation

## 2023-10-02 NOTE — Assessment & Plan Note (Signed)
Treating with Augmentin. 

## 2023-10-03 ENCOUNTER — Telehealth: Payer: Self-pay

## 2023-10-03 LAB — HERPES SIMPLEX VIRUS CULTURE

## 2023-10-03 NOTE — Telephone Encounter (Signed)
Called and left message for patient to call office. Nurse Note* HSV Culture entered in on this patient in error calling to notify patient of this error and that we are working to correct the error and apologize for the inconvenience. Please let us know immediately if you receive a charge for this lab so we can have this charge removed from your account.)

## 2023-10-03 NOTE — Telephone Encounter (Signed)
Patient called back and made aware of HSV Culture entered in on this patient in error calling to notify patient of this error and that we are working to correct the error and apologize for the inconvenience. Please let us know immediately if you receive a charge for this lab so we can have this charge removed from your account Patient verbalized understanding.

## 2023-10-22 ENCOUNTER — Ambulatory Visit: Payer: 59 | Admitting: Obstetrics & Gynecology

## 2023-10-23 ENCOUNTER — Encounter: Payer: Self-pay | Admitting: Obstetrics and Gynecology

## 2023-10-23 ENCOUNTER — Ambulatory Visit (INDEPENDENT_AMBULATORY_CARE_PROVIDER_SITE_OTHER): Payer: 59 | Admitting: Obstetrics and Gynecology

## 2023-10-23 VITALS — BP 124/72 | HR 67 | Ht 64.17 in | Wt 155.0 lb

## 2023-10-23 DIAGNOSIS — Z01419 Encounter for gynecological examination (general) (routine) without abnormal findings: Secondary | ICD-10-CM | POA: Diagnosis not present

## 2023-10-23 DIAGNOSIS — Z23 Encounter for immunization: Secondary | ICD-10-CM | POA: Diagnosis not present

## 2023-10-23 NOTE — Progress Notes (Signed)
57 y.o. [id number] postmenopausal female here for annual exam. Married. Retired. Babysitting her grand-daughter.  Postmenopausal bleeding: none Pelvic discharge or pain: none Breast mass, nipple discharge or skin changes : none Last PAP:     Component Value Date/Time   DIAGPAP  10/13/2021 1105    - Negative for intraepithelial lesion or malignancy (NILM)   ADEQPAP  10/13/2021 1105    Satisfactory for evaluation; transformation zone component PRESENT.   Last mammogram: 08/10/22 BIRADS 1, density b Last colonoscopy: 02/2018 Last DXA: never Sexually active: yes  Exercising: rarely  GYN HISTORY: No significant history  OB History  Gravida Para Term Preterm AB Living  2 2 2     2   SAB IAB Ectopic Multiple Live Births               # Outcome Date GA Lbr Len/2nd Weight Sex Type Anes PTL Lv  2 Term           1 Term             Past Medical History:  Diagnosis Date   ASCUS (atypical squamous cells of undetermined significance) on Pap smear 2012   Hypothyroidism     Past Surgical History:  Procedure Laterality Date   BREAST BIOPSY Left    COLONOSCOPY N/A 02/12/2018   Procedure: COLONOSCOPY;  Surgeon: Malissa Hippo, MD;  Location: [facility];  Service: Endoscopy;  Laterality: N/A;  830    Current Outpatient Medications on File Prior to Visit  Medication Sig Dispense Refill   levothyroxine (SYNTHROID) 50 MCG tablet Take 1 tablet (50 mcg total) by mouth daily before breakfast. 90 tablet 1   No current facility-administered medications on file prior to visit.    Social History   Socioeconomic History   Marital status: Married    Spouse name: Not on file   Number of children: Not on file   Years of education: Not on file   Highest education level: 12th grade  Occupational History   Not on file  Tobacco Use   Smoking status: Former    Current packs/day: 0.00    Average packs/day: 0.3 packs/day for 20.0 years (6.0 ttl pk-yrs)    Types: Cigarettes    Start  date: 05/03/1996    Quit date: 05/03/2016    Years since quitting: 7.4   Smokeless tobacco: Never  Vaping Use   Vaping status: Never Used  Substance and Sexual Activity   Alcohol use: Not Currently   Drug use: No   Sexual activity: Yes    Partners: Male    Birth control/protection: Post-menopausal    Comment: 1st intercourse 32 yo-1 partner  Other Topics Concern   Not on file  Social History Narrative   Not on file   Social Determinants of Health   Financial Resource Strain: Patient Declined (07/29/2023)   Overall Financial Resource Strain (CARDIA)    Difficulty of Paying Living Expenses: Patient declined  Food Insecurity: Patient Declined (07/29/2023)   Hunger Vital Sign    Worried About Running Out of Food in the Last Year: Patient declined    Ran Out of Food in the Last Year: Patient declined  Transportation Needs: No Transportation Needs (07/29/2023)   PRAPARE - Administrator, Civil Service (Medical): No    Lack of Transportation (Non-Medical): No  Physical Activity: Insufficiently Active (07/29/2023)   Exercise Vital Sign    Days of Exercise per Week: 4 days    Minutes of Exercise  per Session: 30 min  Stress: No Stress Concern Present (07/29/2023)   [employer] of Occupational Health - Occupational Stress Questionnaire    Feeling of Stress : Not at all  Social Connections: Moderately Integrated (07/29/2023)   Social Connection and Isolation Panel [NHANES]    Frequency of Communication with Friends and Family: Not on file    Frequency of Social Gatherings with Friends and Family: More than three times a week    Attends Religious Services: More than 4 times per year    Active Member of [employer] or Organizations: No    Attends Engineer, structural: Not on file    Marital Status: Married  Catering manager Violence: Not on file    Family History  Problem Relation Age of Onset   Ovarian cancer Maternal Aunt    Diabetes Cousin    Cancer Mother         sarcoma   Lung cancer Father     No Known Allergies    PE Today's Vitals   10/23/23 1538  BP: 124/72  Pulse: 67  SpO2: 98%  Weight: 155 lb (70.3 kg)  Height: 5' 4.17" (1.63 m)   Body mass index is 26.46 kg/m.  Physical Exam Vitals reviewed. Exam conducted with a chaperone present.  Constitutional:      General: She is not in acute distress.    Appearance: Normal appearance.  HENT:     Head: Normocephalic and atraumatic.     Nose: Nose normal.  Eyes:     Extraocular Movements: Extraocular movements intact.     Conjunctiva/sclera: Conjunctivae normal.  Neck:     Thyroid: No thyroid mass, thyromegaly or thyroid tenderness.  Pulmonary:     Effort: Pulmonary effort is normal.  Chest:     Chest wall: No mass or tenderness.  Breasts:    Right: Normal. No swelling, mass, nipple discharge, skin change or tenderness.     Left: Normal. No swelling, mass, nipple discharge, skin change or tenderness.  Abdominal:     General: There is no distension.     Palpations: Abdomen is soft.     Tenderness: There is no abdominal tenderness.  Genitourinary:    General: Normal vulva.     Exam position: Lithotomy position.     Urethra: No prolapse.     Vagina: Normal. No vaginal discharge or bleeding.     Cervix: Normal. No lesion.     Uterus: Normal. Not enlarged and not tender.      Adnexa: Right adnexa normal and left adnexa normal.  Musculoskeletal:        General: Normal range of motion.     Cervical back: Normal range of motion.  Lymphadenopathy:     Upper Body:     Right upper body: No axillary adenopathy.     Left upper body: No axillary adenopathy.     Lower Body: No right inguinal adenopathy. No left inguinal adenopathy.  Skin:    General: Skin is warm and dry.  Neurological:     General: No focal deficit present.     Mental Status: She is alert.  Psychiatric:        Mood and Affect: Mood normal.        Behavior: Behavior normal.       Assessment and Plan:         Well woman exam with routine gynecological exam Assessment & Plan: Cervical cancer screening performed according to ASCCP guidelines. Encouraged annual mammogram screening Colonoscopy UTD  DXA N/A Labs and immunizations with her primary Encouraged safe sexual practices as indicated Encouraged healthy lifestyle practices with diet and exercise For patients under 50-70yo, I recommend 1200mg  calcium daily and 600IU of vitamin D daily.    Encounter for immunization -     Flu vaccine trivalent PF, 6mos and older(Flulaval,Afluria,Fluarix,Fluzone)    Rosalyn Gess, MD

## 2023-10-23 NOTE — Patient Instructions (Addendum)
For patients under 50-59yo, I recommend 1200mg  calcium daily and 600IU of vitamin D daily. For patients over 59yo, I recommend 1200mg  calcium daily and 800IU of vitamin D daily.  Health Maintenance, Female Adopting a healthy lifestyle and getting preventive care are important in promoting health and wellness. Ask your health care provider about: The right schedule for you to have regular tests and exams. Things you can do on your own to prevent diseases and keep yourself healthy. What should I know about diet, weight, and exercise? Eat a healthy diet  Eat a diet that includes plenty of vegetables, fruits, low-fat dairy products, and lean protein. Do not eat a lot of foods that are high in solid fats, added sugars, or sodium. Maintain a healthy weight Body mass index (BMI) is used to identify weight problems. It estimates body fat based on height and weight. Your health care provider can help determine your BMI and help you achieve or maintain a healthy weight. Get regular exercise Get regular exercise. This is one of the most important things you can do for your health. Most adults should: Exercise for at least 150 minutes each week. The exercise should increase your heart rate and make you sweat (moderate-intensity exercise). Do strengthening exercises at least twice a week. This is in addition to the moderate-intensity exercise. Spend less time sitting. Even light physical activity can be beneficial. Watch cholesterol and blood lipids Have your blood tested for lipids and cholesterol at 59 years of age, then have this test every 5 years. Have your cholesterol levels checked more often if: Your lipid or cholesterol levels are high. You are older than 59 years of age. You are at high risk for heart disease. What should I know about cancer screening? Depending on your health history and family history, you may need to have cancer screening at various ages. This may include screening  for: Breast cancer. Cervical cancer. Colorectal cancer. Skin cancer. Lung cancer. What should I know about heart disease, diabetes, and high blood pressure? Blood pressure and heart disease High blood pressure causes heart disease and increases the risk of stroke. This is more likely to develop in people who have high blood pressure readings or are overweight. Have your blood pressure checked: Every 3-5 years if you are 83-59 years of age. Every year if you are 4 years old or older. Diabetes Have regular diabetes screenings. This checks your fasting blood sugar level. Have the screening done: Once every three years after age 68 if you are at a normal weight and have a low risk for diabetes. More often and at a younger age if you are overweight or have a high risk for diabetes. What should I know about preventing infection? Hepatitis B If you have a higher risk for hepatitis B, you should be screened for this virus. Talk with your health care provider to find out if you are at risk for hepatitis B infection. Hepatitis C Testing is recommended for: Everyone born from [birth year] through 1967. Anyone with known risk factors for hepatitis C. Sexually transmitted infections (STIs) Get screened for STIs, including gonorrhea and chlamydia, if: You are sexually active and are younger than 59 years of age. You are older than 59 years of age and your health care provider tells you that you are at risk for this type of infection. Your sexual activity has changed since you were last screened, and you are at increased risk for chlamydia or gonorrhea. Ask your health care provider if  you are at risk. Ask your health care provider about whether you are at high risk for HIV. Your health care provider may recommend a prescription medicine to help prevent HIV infection. If you choose to take medicine to prevent HIV, you should first get tested for HIV. You should then be tested every 3 months for as long as you  are taking the medicine. Osteoporosis and menopause Osteoporosis is a disease in which the bones lose minerals and strength with aging. This can result in bone fractures. If you are 44 years old or older, or if you are at risk for osteoporosis and fractures, ask your health care provider if you should: Be screened for bone loss. Take a calcium or vitamin D supplement to lower your risk of fractures. Be given hormone replacement therapy (HRT) to treat symptoms of menopause. Follow these instructions at home: Alcohol use Do not drink alcohol if: Your health care provider tells you not to drink. You are pregnant, may be pregnant, or are planning to become pregnant. If you drink alcohol: Limit how much you have to: 0-1 drink a day. Know how much alcohol is in your drink. In the [place] one drink equals one 12 oz bottle of beer (355 mL), one 5 oz glass of wine (148 mL), or one 1 oz glass of hard liquor (44 mL). Lifestyle Do not use any products that contain nicotine or tobacco. These products include cigarettes, chewing tobacco, and vaping devices, such as e-cigarettes. If you need help quitting, ask your health care provider. Do not use street drugs. Do not share needles. Ask your health care provider for help if you need support or information about quitting drugs. General instructions Schedule regular health, dental, and eye exams. Stay current with your vaccines. Tell your health care provider if: You often feel depressed. You have ever been abused or do not feel safe at home. Summary Adopting a healthy lifestyle and getting preventive care are important in promoting health and wellness. Follow your health care provider's instructions about healthy diet, exercising, and getting tested or screened for diseases. Follow your health care provider's instructions on monitoring your cholesterol and blood pressure. This information is not intended to replace advice given to you by your health  care provider. Make sure you discuss any questions you have with your health care provider. Document Revised: 04/17/2021 Document Reviewed: 04/17/2021 Elsevier Patient Education  2024 [employer].

## 2023-10-23 NOTE — Assessment & Plan Note (Signed)
 Cervical cancer screening performed according to ASCCP guidelines. Encouraged annual mammogram screening Colonoscopy UTD DXA N/A Labs and immunizations with her primary Encouraged safe sexual practices as indicated Encouraged healthy lifestyle practices with diet and exercise For patients under 50-59yo, I recommend 1200mg  calcium daily and 600IU of vitamin D daily.

## 2024-01-24 ENCOUNTER — Other Ambulatory Visit: Payer: Self-pay | Admitting: Nurse Practitioner

## 2024-01-24 ENCOUNTER — Telehealth: Payer: Self-pay

## 2024-01-24 MED ORDER — LEVOTHYROXINE SODIUM 50 MCG PO TABS: 50.0000 ug | ORAL_TABLET | Freq: Every day | ORAL | 1 refills | Status: DC

## 2024-01-24 NOTE — Telephone Encounter (Signed)
Prescription Request  01/24/2024  LOV: Visit date not found  What is the name of the medication or equipment? levothyroxine (SYNTHROID) 50 MCG tablet   Have you contacted your pharmacy to request a refill? Yes   Which pharmacy would you like this sent to?   [facility]    Patient notified that their request is being sent to the clinical staff for review and that they should receive a response within 2 business days.   Please advise at Mobile [phone] (mobile)

## 2024-02-03 ENCOUNTER — Other Ambulatory Visit: Payer: Self-pay

## 2024-02-03 DIAGNOSIS — E039 Hypothyroidism, unspecified: Secondary | ICD-10-CM

## 2024-02-05 LAB — TSH+FREE T4
Free T4: 1.14 ng/dL (ref 0.82–1.77)
TSH: 3.01 u[IU]/mL (ref 0.450–4.500)

## 2024-02-07 ENCOUNTER — Other Ambulatory Visit: Payer: Self-pay | Admitting: Family Medicine

## 2024-02-09 ENCOUNTER — Encounter: Payer: Self-pay | Admitting: Family Medicine

## 2024-02-15 ENCOUNTER — Ambulatory Visit
Admission: EM | Admit: 2024-02-15 | Discharge: 2024-02-15 | Disposition: A | Discharge status: Home / Self Care | Attending: Family Medicine | Admitting: Family Medicine

## 2024-02-15 DIAGNOSIS — J101 Influenza due to other identified influenza virus with other respiratory manifestations: Secondary | ICD-10-CM

## 2024-02-15 LAB — POC COVID19/FLU A&B COMBO
Covid Antigen, POC: NEGATIVE
Influenza A Antigen, POC: POSITIVE — AB
Influenza B Antigen, POC: NEGATIVE

## 2024-02-15 MED ORDER — PROMETHAZINE-DM 6.25-15 MG/5ML PO SYRP: 5.0000 mL | ORAL_SOLUTION | Freq: Four times a day (QID) | ORAL | 0 refills | Status: DC | PRN

## 2024-02-15 MED ORDER — FLUTICASONE PROPIONATE 50 MCG/ACT NA SUSP: 1.0000 | Freq: Two times a day (BID) | NASAL | 2 refills | Status: DC

## 2024-02-15 NOTE — ED Triage Notes (Signed)
 Pt reports she has had a fever, body aches, cough, and fever x 2 days

## 2024-02-16 NOTE — ED Provider Notes (Signed)
 [facility]    CSN: [id number] Arrival date & time: 02/15/24  0802      History   Chief Complaint No chief complaint on file.   HPI Catherine Pugh is a 58 y.o. female.   Patient presenting today with 2-day history of fever, body aches, cough, congestion, fatigue.  Denies chest pain, shortness of breath, abdominal pain, vomiting, diarrhea.  So far trying over-the-counter remedies with minimal relief.  No known history of chronic pulmonary disease.    Past Medical History:  Diagnosis Date   ASCUS (atypical squamous cells of undetermined significance) on Pap smear 2012   Hypothyroidism     Patient Active Problem List   Diagnosis Date Noted   Well woman exam with routine gynecological exam 10/23/2023   Sinusitis 10/02/2023   Right shoulder pain 08/21/2022   Hyperlipidemia 10/02/2021   Hypothyroidism 09/03/2016    Past Surgical History:  Procedure Laterality Date   BREAST BIOPSY Left    COLONOSCOPY N/A 02/12/2018   Procedure: COLONOSCOPY;  Surgeon: Malissa Hippo, MD;  Location: [facility];  Service: Endoscopy;  Laterality: N/A;  830    OB History     Gravida  2   Para  2   Term  2   Preterm      AB      Living  2      SAB      IAB      Ectopic      Multiple      Live Births               Home Medications    Prior to Admission medications   Medication Sig Start Date End Date Taking? Authorizing Provider  fluticasone (FLONASE) 50 MCG/ACT nasal spray Place 1 spray into both nostrils 2 (two) times daily. 02/15/24  Yes Particia Nearing, PA-C  promethazine-dextromethorphan (PROMETHAZINE-DM) 6.25-15 MG/5ML syrup Take 5 mLs by mouth 4 (four) times daily as needed. 02/15/24  Yes Particia Nearing, PA-C  levothyroxine (SYNTHROID) 50 MCG tablet TAKE ONE TABLET EVERY MORNING BEFORE BREAKFAST. 02/07/24   Tommie Sams, DO    Family History Family History  Problem Relation Age of Onset   Ovarian cancer Maternal Aunt     Diabetes Cousin    Cancer Mother        sarcoma   Lung cancer Father     Social History Social History   Tobacco Use   Smoking status: Former    Current packs/day: 0.00    Average packs/day: 0.3 packs/day for 20.0 years (6.0 ttl pk-yrs)    Types: Cigarettes    Start date: 05/03/1996    Quit date: 05/03/2016    Years since quitting: 7.7   Smokeless tobacco: Never  Vaping Use   Vaping status: Never Used  Substance Use Topics   Alcohol use: Not Currently   Drug use: No     Allergies   Patient has no known allergies.   Review of Systems Review of Systems Per HPI  Physical Exam Triage Vital Signs ED Triage Vitals  Encounter Vitals Group     BP 02/15/24 0823 130/80     Systolic BP Percentile --      Diastolic BP Percentile --      Pulse Rate 02/15/24 0823 91     Resp 02/15/24 0823 18     Temp 02/15/24 0823 98.5 F (36.9 C)     Temp Source 02/15/24 0823 Oral  SpO2 02/15/24 0823 92 %     Weight --      Height --      Head Circumference --      Peak Flow --      Pain Score 02/15/24 0824 6     Pain Loc --      Pain Education --      Exclude from Growth Chart --    No data found.  Updated Vital Signs BP 130/80 (BP Location: Right Arm)   Pulse 91   Temp 98.5 F (36.9 C) (Oral)   Resp 18   LMP 08/10/2012   SpO2 92%   Visual Acuity Right Eye Distance:   Left Eye Distance:   Bilateral Distance:    Right Eye Near:   Left Eye Near:    Bilateral Near:     Physical Exam Vitals and nursing note reviewed.  Constitutional:      Appearance: Normal appearance.  HENT:     Head: Atraumatic.     Right Ear: Tympanic membrane and external ear normal.     Left Ear: Tympanic membrane and external ear normal.     Nose: Rhinorrhea present.     Mouth/Throat:     Mouth: Mucous membranes are moist.     Pharynx: Posterior oropharyngeal erythema present.  Eyes:     Extraocular Movements: Extraocular movements intact.     Conjunctiva/sclera: Conjunctivae normal.   Cardiovascular:     Rate and Rhythm: Normal rate and regular rhythm.     Heart sounds: Normal heart sounds.  Pulmonary:     Effort: Pulmonary effort is normal.     Breath sounds: Normal breath sounds. No wheezing or rales.  Musculoskeletal:        General: Normal range of motion.     Cervical back: Normal range of motion and neck supple.  Skin:    General: Skin is warm and dry.  Neurological:     Mental Status: She is alert and oriented to person, place, and time.  Psychiatric:        Mood and Affect: Mood normal.        Thought Content: Thought content normal.      UC Treatments / Results  Labs (all labs ordered are listed, but only abnormal results are displayed) Labs Reviewed  POC COVID19/FLU A&B COMBO - Abnormal; Notable for the following components:      Result Value   Influenza A Antigen, POC Positive (*)    All other components within normal limits    EKG   Radiology No results found.  Procedures Procedures (including critical care time)  Medications Ordered in UC Medications - No data to display  Initial Impression / Assessment and Plan / UC Course  I have reviewed the triage vital signs and the nursing notes.  Pertinent labs & imaging results that were available during my care of the patient were reviewed by me and considered in my medical decision making (see chart for details).     Vitals and exam reassuring today, rapid flu positive for influenza A.  Treat with Phenergan DM, Flonase, supportive over-the-counter medications and home care.  Return for worsening symptoms.  Final Clinical Impressions(s) / UC Diagnoses   Final diagnoses:  Influenza A   Discharge Instructions   None    ED Prescriptions     Medication Sig Dispense Auth. Provider   promethazine-dextromethorphan (PROMETHAZINE-DM) 6.25-15 MG/5ML syrup Take 5 mLs by mouth 4 (four) times daily as needed. 100 mL Maurice March,  Salley Hews, PA-C   fluticasone [facility]) 50 MCG/ACT nasal  spray Place 1 spray into both nostrils 2 (two) times daily. 16 g Particia Nearing, [place]      PDMP not reviewed this encounter.   Particia Nearing, [place] 02/16/24 1213

## 2024-07-06 ENCOUNTER — Encounter: Payer: Self-pay | Admitting: *Deleted

## 2024-07-06 ENCOUNTER — Other Ambulatory Visit: Payer: Self-pay | Admitting: Family Medicine

## 2024-07-06 ENCOUNTER — Telehealth: Payer: Self-pay | Admitting: *Deleted

## 2024-07-06 MED ORDER — LEVOTHYROXINE SODIUM 50 MCG PO TABS: 50.0000 ug | ORAL_TABLET | Freq: Every day | ORAL | 1 refills | Status: DC

## 2024-07-06 NOTE — Telephone Encounter (Signed)
 Copied from CRM [id number]. Topic: Clinical - Request for Lab/Test Order >> Jul 06, 2024 10:59 AM Montie POUR wrote: Reason for CRM:  Please call Gina at [phone] to discuss lab order for thyroid . She wants to come in this week and have her lab completed so Dr. Bluford can call in her thyroid . Thanks

## 2024-07-06 NOTE — Telephone Encounter (Signed)
 Patient notified via mychart

## 2024-07-09 ENCOUNTER — Encounter: Payer: Self-pay | Admitting: Family Medicine

## 2024-07-09 ENCOUNTER — Ambulatory Visit: Admitting: Family Medicine

## 2024-07-09 VITALS — BP 102/71 | HR 68 | Temp 97.2°F | Ht 64.17 in | Wt 159.0 lb

## 2024-07-09 DIAGNOSIS — E039 Hypothyroidism, unspecified: Secondary | ICD-10-CM | POA: Diagnosis not present

## 2024-07-09 NOTE — Patient Instructions (Signed)
Follow up annually. ? ?Take care ? ?Dr. Kivon Aprea  ?

## 2024-07-09 NOTE — Progress Notes (Signed)
 Subjective:  Patient ID: Catherine Pugh, female    DOB: Dec 30, 1965  Age: 58 y.o. MRN: [id number]  CC:   Chief Complaint  Patient presents with   Medical Management of Chronic Issues    Medication f/u.    HPI:  58 year old female presents for follow up.  Working on [employer] loss with [employer].  Recently requested refill on Levothyroxine . Has been stable. Has had recent TSH. Medication has been refilled.  Patient Active Problem List   Diagnosis Date Noted   Hyperlipidemia 10/02/2021   Hypothyroidism 09/03/2016    Social Hx   Social History   Socioeconomic History   Marital status: Married    Spouse name: Not on file   Number of children: Not on file   Years of education: Not on file   Highest education level: 12th grade  Occupational History   Not on file  Tobacco Use   Smoking status: Former    Current packs/day: 0.00    Average packs/day: 0.3 packs/day for 20.0 years (6.0 ttl pk-yrs)    Types: Cigarettes    Start date: 05/03/1996    Quit date: 05/03/2016    Years since quitting: 8.1   Smokeless tobacco: Never  Vaping Use   Vaping status: Never Used  Substance and Sexual Activity   Alcohol use: Not Currently   Drug use: No   Sexual activity: Yes    Partners: Male    Birth control/protection: Post-menopausal    Comment: 1st intercourse 36 yo-1 partner  Other Topics Concern   Not on file  Social History Narrative   Not on file   Social Drivers of Health   Financial Resource Strain: Patient Declined (07/29/2023)   Overall Financial Resource Strain (CARDIA)    Difficulty of Paying Living Expenses: Patient declined  Food Insecurity: Patient Declined (07/29/2023)   Hunger Vital Sign    Worried About Running Out of Food in the Last Year: Patient declined    Ran Out of Food in the Last Year: Patient declined  Transportation Needs: No Transportation Needs (07/29/2023)   PRAPARE - Administrator, Civil Service (Medical): No    Lack of  Transportation (Non-Medical): No  Physical Activity: Insufficiently Active (07/29/2023)   Exercise Vital Sign    Days of Exercise per Week: 4 days    Minutes of Exercise per Session: 30 min  Stress: No Stress Concern Present (07/29/2023)   [employer] of Occupational Health - Occupational Stress Questionnaire    Feeling of Stress : Not at all  Social Connections: Moderately Integrated (07/29/2023)   Social Connection and Isolation Panel    Frequency of Communication with Friends and Family: Not on file    Frequency of Social Gatherings with Friends and Family: More than three times a week    Attends Religious Services: More than 4 times per year    Active Member of [employer] or Organizations: No    Attends Engineer, structural: Not on file    Marital Status: Married    Review of Systems  Respiratory: Negative.    Cardiovascular: Negative.     Objective:  BP 102/71   Pulse 68   Temp (!) 97.2 F (36.2 C)   Ht 5' 4.17 (1.63 m)   Wt 159 lb (72.1 kg)   LMP 08/10/2012   SpO2 99%   BMI 27.15 kg/m      07/09/2024    4:02 PM 02/15/2024    8:23 AM 10/23/2023  3:38 PM  BP/Weight  Systolic BP 102 130 124  Diastolic BP 71 80 72  Wt. (Lbs) 159  155  BMI 27.15 kg/m2  26.46 kg/m2    Physical Exam Vitals and nursing note reviewed.  Constitutional:      General: She is not in acute distress.    Appearance: Normal appearance.  HENT:     Head: Normocephalic and atraumatic.  Eyes:     General:        Right eye: No discharge.        Left eye: No discharge.     Conjunctiva/sclera: Conjunctivae normal.  Cardiovascular:     Rate and Rhythm: Normal rate and regular rhythm.  Pulmonary:     Effort: Pulmonary effort is normal.     Breath sounds: Normal breath sounds. No wheezing, rhonchi or rales.  Neurological:     Mental Status: She is alert.  Psychiatric:        Mood and Affect: Mood normal.        Behavior: Behavior normal.     Lab Results  Component Value Date    WBC 9.6 08/02/2023   HGB 13.1 08/02/2023   HCT 39.8 08/02/2023   PLT 393 08/02/2023   GLUCOSE 104 (H) 08/02/2023   CHOL 231 (H) 08/02/2023   TRIG 86 08/02/2023   HDL 85 08/02/2023   LDLCALC 131 (H) 08/02/2023   ALT 20 08/02/2023   AST 16 08/02/2023   NA 145 (H) 08/02/2023   K 4.6 08/02/2023   CL 108 (H) 08/02/2023   CREATININE 0.82 08/02/2023   BUN 14 08/02/2023   CO2 20 08/02/2023   TSH 3.010 02/04/2024     Assessment & Plan:  Hypothyroidism, unspecified type Assessment & Plan: Stable. Medication has been refilled.     Follow-up:  Annually  Mashell Sieben DO [facility]

## 2024-07-09 NOTE — Assessment & Plan Note (Signed)
 Stable. Medication has been refilled.

## 2024-09-18 ENCOUNTER — Telehealth: Admitting: Physician Assistant

## 2024-09-18 DIAGNOSIS — R21 Rash and other nonspecific skin eruption: Secondary | ICD-10-CM

## 2024-09-18 MED ORDER — NYSTATIN 100000 UNIT/GM EX CREA: 1.0000 | TOPICAL_CREAM | Freq: Two times a day (BID) | CUTANEOUS | 0 refills | Status: DC

## 2024-09-18 NOTE — Progress Notes (Signed)
 E Visit for Rash  We are sorry that you are not feeling well. Here is how we plan to help!  Based upon your presentation it appears you have a fungal infection.  I have prescribed: and Nystatin cream apply to the affected area twice daily   HOME CARE:  Take cool showers and avoid direct sunlight. Apply cool compress or wet dressings. Take a bath in an oatmeal bath.  Sprinkle content of one Aveeno packet under running faucet with comfortably warm water.  Bathe for 15-20 minutes, 1-2 times daily.  Pat dry with a towel. Do not rub the rash. Use hydrocortisone cream. Take an antihistamine like Benadryl for widespread rashes that itch.  The adult dose of Benadryl is 25-50 mg by mouth 4 times daily. Caution:  This type of medication may cause sleepiness.  Do not drink alcohol, drive, or operate dangerous machinery while taking antihistamines.  Do not take these medications if you have prostate enlargement.  Read package instructions thoroughly on all medications that you take.  GET HELP RIGHT AWAY IF:  Symptoms don't go away after treatment. Severe itching that persists. If you rash spreads or swells. If you rash begins to smell. If it blisters and opens or develops a yellow-brown crust. You develop a fever. You have a sore throat. You become short of breath.  MAKE SURE YOU:  Understand these instructions. Will watch your condition. Will get help right away if you are not doing well or get worse.  Thank you for choosing an e-visit. Your e-visit answers were reviewed by a board certified advanced clinical practitioner to complete your personal care plan. Depending upon the condition, your plan could have included both over the counter or prescription medications. Please review your pharmacy choice. Be sure that the pharmacy you have chosen is open so that you can pick up your prescription now.  If there is a problem you may message your provider in MyChart to have the prescription routed  to another pharmacy. Your safety is important to us . If you have drug allergies check your prescription carefully.  For the next 24 hours, you can use MyChart to ask questions about today's visit, request a non-urgent call back, or ask for a work or school excuse from your e-visit provider. You will get an email in the next two days asking about your experience. I hope that your e-visit has been valuable and will speed your recovery.  I have spent 5 minutes in review of e-visit questionnaire, review and updating patient chart, medical decision making and response to patient.   Delon CHRISTELLA Dickinson, PA-C

## 2024-09-22 ENCOUNTER — Ambulatory Visit: Payer: Self-pay

## 2024-09-22 NOTE — Telephone Encounter (Signed)
 FYI Only or Action Required?: FYI only for provider.  Patient was last seen in primary care on 07/09/2024 by Cook, Jayce G, DO.  Called Nurse Triage reporting Rash and Herpes Zoster.  Symptoms began a week ago.  Interventions attempted: Prescription medications: nystatin cream.  Symptoms are: left groin and left upper leg 2 clusters of red/purple dots, mild tenderness, left leg numbness unchanged.  Triage Disposition: See Physician Within 24 Hours  Patient/caregiver understands and will follow disposition?: Yes          Copied from CRM #[id number]. Topic: Clinical - Red Word Triage >> Sep 22, 2024  1:38 PM Fonda T wrote: [facility] that prompted transfer to Nurse Triage: Small area, about the size of a quarter, and the other about the size of a quarter at bikini line. Thinks it may be shingles.   Patient reports has a rash, and slight numbness in the area.  Patient is concerned and wants to have confirmation of possible having shingles as she watches her small grandchildren, and needs to take precaution for further exposure. Reason for Disposition  [1] Shingles rash AND [2] onset > 72 hours ago (3 days)  Answer Assessment - Initial Assessment Questions Patient was seen at the dermatologist today and was told the rash is shingles. She would like to know if she can be seen by Dr Bluford sooner than Thursday for second opinion. She initially thought this was a spider bite and was seen on 09/18/24 for evisit and placed on nystatin cream, did not resolve rash.  1. APPEARANCE of RASH: What does the rash look like?      The size of a quarter/dime, 2 spots. Purple/red dots in clusters. Denies any blisters. She thinks the second spot on her upper leg may be new as of Friday but she is unsure.  2. LOCATION: Where is the rash located?      Inside of upper left leg and left groin.  3. ONSET: When did the rash start?      X 1 week.  4. ITCHING: Does the rash itch? If Yes, ask: How  bad is the itch?  (Scale 1-10; or mild, moderate, severe)     No.  5. PAIN: Does the rash hurt? If Yes, ask: How bad is the pain?  (Scale 0-10; or none, mild, moderate, severe)     Little bit tender. She states Thursday and Friday there was some pain as well as vaginal pressure and cramping.  6. OTHER SYMPTOMS: Do you have any other symptoms? (e.g., fever)     Left leg numbness (mild/slight) since Thursday. No fever.  7. PREGNANCY: Is there any chance you are pregnant? When was your last menstrual period?     N/A.  Protocols used: Shingles (Zoster)-A-AH

## 2024-09-23 ENCOUNTER — Ambulatory Visit: Admitting: Family Medicine

## 2024-09-23 ENCOUNTER — Encounter: Payer: Self-pay | Admitting: Family Medicine

## 2024-09-23 VITALS — BP 118/84 | HR 87 | Resp 18 | Ht 65.0 in | Wt 159.0 lb

## 2024-09-23 DIAGNOSIS — B029 Zoster without complications: Secondary | ICD-10-CM | POA: Diagnosis not present

## 2024-09-23 NOTE — Assessment & Plan Note (Signed)
 Informed patient that I believe diagnosis is correct. Finish valtrex.  Wash hands well. Keep the area clean. Questions answered regarding exposures/risk.

## 2024-09-23 NOTE — Progress Notes (Signed)
 Subjective:  Patient ID: Catherine Pugh, female    DOB: [birth year]  Age: 58 y.o. MRN: [id number]  CC:   Chief Complaint  Patient presents with   Rash    Rash on upper left thigh and groin, showed up last week. Mild itching and tenderness. Told by dermatology that it is shingles, is wanting second opinion.     HPI:  58 year old female presents for evaluation of the above.  Developed rash to the left upper thigh/groin last week. Raised, vesicular rash. Mild itching and tenderness. Was seen by dermatology and was diagnosed with shingles. Is currently on Valtrex. Rash is drying up.  Patient wants me to examine and has questions today.  Patient Active Problem List   Diagnosis Date Noted   Herpes zoster 09/23/2024   Hyperlipidemia 10/02/2021   Hypothyroidism 09/03/2016    Social Hx   Social History   Socioeconomic History   Marital status: Married    Spouse name: Not on file   Number of children: Not on file   Years of education: Not on file   Highest education level: 12th grade  Occupational History   Not on file  Tobacco Use   Smoking status: Former    Current packs/day: 0.00    Average packs/day: 0.3 packs/day for 20.0 years (6.0 ttl pk-yrs)    Types: Cigarettes    Start date: 05/03/1996    Quit date: 05/03/2016    Years since quitting: 8.3   Smokeless tobacco: Never  Vaping Use   Vaping status: Never Used  Substance and Sexual Activity   Alcohol use: Not Currently   Drug use: No   Sexual activity: Yes    Partners: Male    Birth control/protection: Post-menopausal    Comment: 1st intercourse 51 yo-1 partner  Other Topics Concern   Not on file  Social History Narrative   Not on file   Social Drivers of Health   Financial Resource Strain: Patient Declined (07/29/2023)   Overall Financial Resource Strain (CARDIA)    Difficulty of Paying Living Expenses: Patient declined  Food Insecurity: Patient Declined (07/29/2023)   Hunger Vital Sign    Worried  About Running Out of Food in the Last Year: Patient declined    Ran Out of Food in the Last Year: Patient declined  Transportation Needs: No Transportation Needs (07/29/2023)   PRAPARE - Administrator, Civil Service (Medical): No    Lack of Transportation (Non-Medical): No  Physical Activity: Insufficiently Active (07/29/2023)   Exercise Vital Sign    Days of Exercise per Week: 4 days    Minutes of Exercise per Session: 30 min  Stress: No Stress Concern Present (07/29/2023)   [employer] of Occupational Health - Occupational Stress Questionnaire    Feeling of Stress : Not at all  Social Connections: Moderately Integrated (07/29/2023)   Social Connection and Isolation Panel    Frequency of Communication with Friends and Family: Not on file    Frequency of Social Gatherings with Friends and Family: More than three times a week    Attends Religious Services: More than 4 times per year    Active Member of [employer] or Organizations: No    Attends Engineer, structural: Not on file    Marital Status: Married    Review of Systems Per HPI  Objective:  BP 118/84   Pulse 87   Resp 18   Ht 5' 5 (1.651 m)   Wt 159 lb (72.1  kg)   LMP 08/10/2012   SpO2 97%   BMI 26.46 kg/m      09/23/2024    1:38 PM 07/09/2024    4:02 PM 02/15/2024    8:23 AM  BP/Weight  Systolic BP 118 102 130  Diastolic BP 84 71 80  Wt. (Lbs) 159 159   BMI 26.46 kg/m2 27.15 kg/m2     Physical Exam Vitals and nursing note reviewed. Exam conducted with a chaperone present.  Constitutional:      General: She is not in acute distress.    Appearance: Normal appearance.  HENT:     Head: Normocephalic and atraumatic.  Skin:    Comments: Papular rash noted to the left upper thigh/groin. Appears to be resolving.   Neurological:     Mental Status: She is alert.     Lab Results  Component Value Date   WBC 9.6 08/02/2023   HGB 13.1 08/02/2023   HCT 39.8 08/02/2023   PLT 393 08/02/2023    GLUCOSE 104 (H) 08/02/2023   CHOL 231 (H) 08/02/2023   TRIG 86 08/02/2023   HDL 85 08/02/2023   LDLCALC 131 (H) 08/02/2023   ALT 20 08/02/2023   AST 16 08/02/2023   NA 145 (H) 08/02/2023   K 4.6 08/02/2023   CL 108 (H) 08/02/2023   CREATININE 0.82 08/02/2023   BUN 14 08/02/2023   CO2 20 08/02/2023   TSH 3.010 02/04/2024     Assessment & Plan:  Herpes zoster without complication Assessment & Plan: Informed patient that I believe diagnosis is correct. Finish valtrex.  Wash hands well. Keep the area clean. Questions answered regarding exposures/risk.     Follow-up:  Return if symptoms worsen or fail to improve.  Jacqulyn Ahle DO [facility]

## 2024-09-24 ENCOUNTER — Ambulatory Visit: Payer: Self-pay | Admitting: Family Medicine

## 2024-11-26 ENCOUNTER — Ambulatory Visit: Admitting: Obstetrics and Gynecology

## 2024-11-26 ENCOUNTER — Other Ambulatory Visit (HOSPITAL_COMMUNITY)
Admission: RE | Admit: 2024-11-26 | Discharge: 2024-11-26 | Disposition: A | Discharge status: Home / Self Care | Source: Ambulatory Visit | Attending: Obstetrics and Gynecology | Admitting: Obstetrics and Gynecology

## 2024-11-26 ENCOUNTER — Encounter: Payer: Self-pay | Admitting: Obstetrics and Gynecology

## 2024-11-26 VITALS — BP 116/80 | HR 64 | Temp 97.7°F | Ht 63.5 in | Wt 158.0 lb

## 2024-11-26 DIAGNOSIS — Z1331 Encounter for screening for depression: Secondary | ICD-10-CM

## 2024-11-26 DIAGNOSIS — Z23 Encounter for immunization: Secondary | ICD-10-CM

## 2024-11-26 DIAGNOSIS — Z124 Encounter for screening for malignant neoplasm of cervix: Secondary | ICD-10-CM | POA: Diagnosis present

## 2024-11-26 DIAGNOSIS — Z01419 Encounter for gynecological examination (general) (routine) without abnormal findings: Secondary | ICD-10-CM | POA: Diagnosis not present

## 2024-11-26 NOTE — Progress Notes (Signed)
 58 y.o. [id number] postmenopausal female here for annual exam. Married. Retired. Babysitting her grand-daughter. Several grands 29mon-35yr. Grandson due April. PCP: Cook, Jayce G, DO  Doing well, no complaints. Planning to have MMG before end of year. Interested in flu shot.  Postmenopausal bleeding: none Pelvic discharge or pain: none Breast mass, nipple discharge or skin changes : none  Last PAP:     Component Value Date/Time   DIAGPAP  10/13/2021 1105    - Negative for intraepithelial lesion or malignancy (NILM)   ADEQPAP  10/13/2021 1105    Satisfactory for evaluation; transformation zone component PRESENT.   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 in [facility]  PHQ-2 Total Score 0    GYN HISTORY: No significant history  OB History  Gravida Para Term Preterm AB Living  2 2 2   2   SAB IAB Ectopic Multiple Live Births      2    # Outcome Date GA Lbr Len/2nd Weight Sex Type Anes PTL Lv  2 Term      Vag-Spont   LIV  1 Term      Vag-Spont   LIV    Past Medical History:  Diagnosis Date   ASCUS (atypical squamous cells of undetermined significance) on Pap smear 2012   Hypothyroidism     Past Surgical History:  Procedure Laterality Date   BREAST BIOPSY Left    COLONOSCOPY N/A 02/12/2018   Procedure: COLONOSCOPY;  Surgeon: Golda Claudis PENNER, MD;  Location: [facility];  Service: Endoscopy;  Laterality: N/A;  830    Current Outpatient Medications on File Prior to Visit  Medication Sig Dispense Refill   levothyroxine  (SYNTHROID ) 50 MCG tablet Take 1 tablet (50 mcg total) by mouth daily before breakfast. 90 tablet 1   No current facility-administered medications on file prior to visit.    Social History   Socioeconomic History   Marital status: Married    Spouse name: Not on file    Number of children: Not on file   Years of education: Not on file   Highest education level: 12th grade  Occupational History   Not on file  Tobacco Use   Smoking status: Former    Current packs/day: 0.00    Average packs/day: 0.3 packs/day for 20.0 years (6.0 ttl pk-yrs)    Types: Cigarettes    Start date: 05/03/1996    Quit date: 05/03/2016    Years since quitting: 8.5   Smokeless tobacco: Never  Vaping Use   Vaping status: Never Used  Substance and Sexual Activity   Alcohol use: Not Currently   Drug use: No   Sexual activity: Yes    Partners: Male    Birth control/protection: Post-menopausal    Comment: 1st intercourse 72 yo-1 partner  Other Topics Concern   Not on file  Social History Narrative   Not on file   Social Drivers of Health   Tobacco Use: Medium Risk (09/23/2024)   Patient History    Smoking Tobacco Use: Former    Smokeless Tobacco Use: Never    Passive Exposure: Not on file  Financial Resource Strain: Patient Declined (07/29/2023)   Overall Financial Resource Strain (CARDIA)    Difficulty of Paying Living Expenses: Patient declined  Food Insecurity: Patient Declined (07/29/2023)   Hunger Vital Sign    Worried  About Running Out of Food in the Last Year: Patient declined    Ran Out of Food in the Last Year: Patient declined  Transportation Needs: No Transportation Needs (07/29/2023)   PRAPARE - Administrator, Civil Service (Medical): No    Lack of Transportation (Non-Medical): No  Physical Activity: Insufficiently Active (07/29/2023)   Exercise Vital Sign    Days of Exercise per Week: 4 days    Minutes of Exercise per Session: 30 min  Stress: No Stress Concern Present (07/29/2023)   [employer] of Occupational Health - Occupational Stress Questionnaire    Feeling of Stress : Not at all  Social Connections: Moderately Integrated (07/29/2023)   Social Connection and Isolation Panel    Frequency of Communication with Friends and Family:  Not on file    Frequency of Social Gatherings with Friends and Family: More than three times a week    Attends Religious Services: More than 4 times per year    Active Member of [employer] or Organizations: No    Attends Engineer, Structural: Not on file    Marital Status: Married  Catering Manager Violence: Not on file  Depression (PHQ2-9): Low Risk (11/26/2024)   Depression (PHQ2-9)    PHQ-2 Score: 0  Alcohol Screen: Not on file  Housing: Low Risk (07/29/2023)   Housing    Last Housing Risk Score: 0  Utilities: Not on file  Health Literacy: Not on file    Family History  Problem Relation Age of Onset   Ovarian cancer Maternal Aunt    Diabetes Cousin    Cancer Mother        sarcoma   Lung cancer Father     No Known Allergies    PE Today's Vitals   11/26/24 1154  BP: 116/80  Pulse: 64  Temp: 97.7 F (36.5 C)  TempSrc: Oral  SpO2: 98%  Weight: 158 lb (71.7 kg)  Height: 5' 3.5 (1.613 m)   Body mass index is 27.55 kg/m.  Physical Exam Vitals reviewed. Exam conducted with a chaperone present.  Constitutional:      General: She is not in acute distress.    Appearance: Normal appearance.  HENT:     Head: Normocephalic and atraumatic.     Nose: Nose normal.  Eyes:     Extraocular Movements: Extraocular movements intact.     Conjunctiva/sclera: Conjunctivae normal.  Pulmonary:     Effort: Pulmonary effort is normal.  Chest:     Chest wall: No mass or tenderness.  Breasts:    Right: Normal. No swelling, mass, nipple discharge, skin change or tenderness.     Left: Normal. No swelling, mass, nipple discharge, skin change or tenderness.  Abdominal:     General: There is no distension.     Palpations: Abdomen is soft.     Tenderness: There is no abdominal tenderness.  Genitourinary:    General: Normal vulva.     Exam position: Lithotomy position.     Urethra: No prolapse.     Vagina: Normal. No vaginal discharge or bleeding.     Cervix: Normal. No  lesion.     Uterus: Normal. Not enlarged and not tender.      Adnexa: Right adnexa normal and left adnexa normal.  Musculoskeletal:        General: Normal range of motion.  Lymphadenopathy:     Upper Body:     Right upper body: No axillary adenopathy.     Left upper body:  No axillary adenopathy.     Lower Body: No right inguinal adenopathy. No left inguinal adenopathy.  Skin:    General: Skin is warm and dry.  Neurological:     General: No focal deficit present.     Mental Status: She is alert.  Psychiatric:        Mood and Affect: Mood normal.        Behavior: Behavior normal.      Assessment and Plan:        Well woman exam with routine gynecological exam Assessment & Plan: Cervical cancer screening performed according to ASCCP guidelines. Encouraged annual mammogram screening Colonoscopy UTD DXA N/A Labs and immunizations with her primary Encouraged safe sexual practices as indicated Encouraged healthy lifestyle practices with diet and exercise For patients under 50-70yo, I recommend 1200mg  calcium daily and 600IU of vitamin D daily.    Negative depression screening  Cervical cancer screening -     Cytology - PAP  Flu vaccine need  Encounter for immunization -     Flu vaccine trivalent PF, 6mos and older(Flulaval,Afluria,Fluarix,Fluzone)   Vera LULLA Pa, MD

## 2024-11-26 NOTE — Patient Instructions (Signed)
 For patients under 50-60yo, I recommend 1200mg  calcium  daily and 600IU of vitamin D daily. For patients over 60yo, I recommend 1200mg  calcium  daily and 800IU of vitamin D daily.  Health Maintenance, Female Adopting a healthy lifestyle and getting preventive care are important in promoting health and wellness. Ask your health care provider about: The right schedule for you to have regular tests and exams. Things you can do on your own to prevent diseases and keep yourself healthy. What should I know about diet, weight, and exercise? Eat a healthy diet  Eat a diet that includes plenty of vegetables, fruits, low-fat dairy products, and lean protein. Do not eat a lot of foods that are high in solid fats, added sugars, or sodium. Maintain a healthy weight Body mass index (BMI) is used to identify weight problems. It estimates body fat based on height and weight. Your health care provider can help determine your BMI and help you achieve or maintain a healthy weight. Get regular exercise Get regular exercise. This is one of the most important things you can do for your health. Most adults should: Exercise for at least 150 minutes each week. The exercise should increase your heart rate and make you sweat (moderate-intensity exercise). Do strengthening exercises at least twice a week. This is in addition to the moderate-intensity exercise. Spend less time sitting. Even light physical activity can be beneficial. Watch cholesterol and blood lipids Have your blood tested for lipids and cholesterol at 60 years of age, then have this test every 5 years. Have your cholesterol levels checked more often if: Your lipid or cholesterol levels are high. You are older than 60 years of age. You are at high risk for heart disease. What should I know about cancer screening? Depending on your health history and family history, you may need to have cancer screening at various ages. This may include screening  for: Breast cancer. Cervical cancer. Colorectal cancer. Skin cancer. Lung cancer. What should I know about heart disease, diabetes, and high blood pressure? Blood pressure and heart disease High blood pressure causes heart disease and increases the risk of stroke. This is more likely to develop in people who have high blood pressure readings or are overweight. Have your blood pressure checked: Every 3-5 years if you are 25-57 years of age. Every year if you are 24 years old or older. Diabetes Have regular diabetes screenings. This checks your fasting blood sugar level. Have the screening done: Once every three years after age 62 if you are at a normal weight and have a low risk for diabetes. More often and at a younger age if you are overweight or have a high risk for diabetes. What should I know about preventing infection? Hepatitis B If you have a higher risk for hepatitis B, you should be screened for this virus. Talk with your health care provider to find out if you are at risk for hepatitis B infection. Hepatitis C Testing is recommended for: Everyone born from [birth year] through 1967. Anyone with known risk factors for hepatitis C. Sexually transmitted infections (STIs) Get screened for STIs, including gonorrhea and chlamydia, if: You are sexually active and are younger than 60 years of age. You are older than 60 years of age and your health care provider tells you that you are at risk for this type of infection. Your sexual activity has changed since you were last screened, and you are at increased risk for chlamydia or gonorrhea. Ask your health care provider if  you are at risk. Ask your health care provider about whether you are at high risk for HIV. Your health care provider may recommend a prescription medicine to help prevent HIV infection. If you choose to take medicine to prevent HIV, you should first get tested for HIV. You should then be tested every 3 months for as long as you  are taking the medicine. Osteoporosis and menopause Osteoporosis is a disease in which the bones lose minerals and strength with aging. This can result in bone fractures. If you are 72 years old or older, or if you are at risk for osteoporosis and fractures, ask your health care provider if you should: Be screened for bone loss. Take a calcium  or vitamin D supplement to lower your risk of fractures. Be given hormone replacement therapy (HRT) to treat symptoms of menopause. Follow these instructions at home: Alcohol use Do not drink alcohol if: Your health care provider tells you not to drink. You are pregnant, may be pregnant, or are planning to become pregnant. If you drink alcohol: Limit how much you have to: 0-1 drink a day. Know how much alcohol is in your drink. In the [place] one drink equals one 12 oz bottle of beer (355 mL), one 5 oz glass of wine (148 mL), or one 1 oz glass of hard liquor (44 mL). Lifestyle Do not use any products that contain nicotine or tobacco. These products include cigarettes, chewing tobacco, and vaping devices, such as e-cigarettes. If you need help quitting, ask your health care provider. Do not use street drugs. Do not share needles. Ask your health care provider for help if you need support or information about quitting drugs. General instructions Schedule regular health, dental, and eye exams. Stay current with your vaccines. Tell your health care provider if: You often feel depressed. You have ever been abused or do not feel safe at home. Summary Adopting a healthy lifestyle and getting preventive care are important in promoting health and wellness. Follow your health care provider's instructions about healthy diet, exercising, and getting tested or screened for diseases. Follow your health care provider's instructions on monitoring your cholesterol and blood pressure. This information is not intended to replace advice given to you by your health  care provider. Make sure you discuss any questions you have with your health care provider. Document Revised: 04/17/2021 Document Reviewed: 04/17/2021 Elsevier Patient Education  2024 [employer].

## 2024-11-26 NOTE — Assessment & Plan Note (Signed)
 Cervical cancer screening performed according to ASCCP guidelines. Encouraged annual mammogram screening Colonoscopy UTD DXA N/A Labs and immunizations with her primary Encouraged safe sexual practices as indicated Encouraged healthy lifestyle practices with diet and exercise For patients under 50-60yo, I recommend 1200mg  calcium daily and 600IU of vitamin D daily.

## 2024-12-01 ENCOUNTER — Ambulatory Visit: Payer: Self-pay | Admitting: Obstetrics and Gynecology

## 2024-12-01 LAB — CYTOLOGY - PAP
Comment: NEGATIVE
Diagnosis: NEGATIVE
Diagnosis: REACTIVE
High risk HPV: NEGATIVE

## 2024-12-23 ENCOUNTER — Other Ambulatory Visit: Payer: Self-pay | Admitting: Obstetrics and Gynecology

## 2024-12-23 DIAGNOSIS — Z1231 Encounter for screening mammogram for malignant neoplasm of breast: Secondary | ICD-10-CM

## 2024-12-25 ENCOUNTER — Ambulatory Visit
Admission: RE | Admit: 2024-12-25 | Discharge: 2024-12-25 | Disposition: A | Discharge status: Home / Self Care | Source: Ambulatory Visit | Attending: Obstetrics and Gynecology | Admitting: Obstetrics and Gynecology

## 2024-12-25 DIAGNOSIS — Z1231 Encounter for screening mammogram for malignant neoplasm of breast: Secondary | ICD-10-CM

## 2024-12-30 ENCOUNTER — Other Ambulatory Visit: Payer: Self-pay | Admitting: Family Medicine

## 2025-01-06 ENCOUNTER — Ambulatory Visit: Payer: Self-pay | Admitting: Obstetrics and Gynecology
